# Patient Record
Sex: Female | Born: 1980 | Hispanic: Yes | Marital: Married | Smoking: Never smoker
Health system: Southern US, Community
[De-identification: ages and names within clinical notes are randomized; demographics above are authoritative.]

## PROBLEM LIST (undated history)

## (undated) DIAGNOSIS — K829 Disease of gallbladder, unspecified: Secondary | ICD-10-CM

## (undated) DIAGNOSIS — I10 Essential (primary) hypertension: Secondary | ICD-10-CM

## (undated) DIAGNOSIS — K219 Gastro-esophageal reflux disease without esophagitis: Secondary | ICD-10-CM

## (undated) DIAGNOSIS — D649 Anemia, unspecified: Secondary | ICD-10-CM

## (undated) DIAGNOSIS — Z803 Family history of malignant neoplasm of breast: Secondary | ICD-10-CM

## (undated) DIAGNOSIS — Z1371 Encounter for nonprocreative screening for genetic disease carrier status: Secondary | ICD-10-CM

## (undated) HISTORY — DX: Disease of gallbladder, unspecified: K82.9

## (undated) HISTORY — DX: Anemia, unspecified: D64.9

## (undated) HISTORY — DX: Essential (primary) hypertension: I10

## (undated) HISTORY — PX: CHOLECYSTECTOMY: SHX55

## (undated) HISTORY — DX: Encounter for nonprocreative screening for genetic disease carrier status: Z13.71

## (undated) HISTORY — PX: BUNIONECTOMY: SHX129

## (undated) HISTORY — DX: Family history of malignant neoplasm of breast: Z80.3

## (undated) HISTORY — DX: Gastro-esophageal reflux disease without esophagitis: K21.9

---

## 2006-08-09 ENCOUNTER — Other Ambulatory Visit: Admission: RE | Admit: 2006-08-09 | Discharge: 2006-08-09 | Payer: Self-pay | Admitting: Gynecology

## 2007-05-06 ENCOUNTER — Emergency Department: Payer: Self-pay | Admitting: Emergency Medicine

## 2009-04-06 ENCOUNTER — Encounter: Admission: RE | Admit: 2009-04-06 | Discharge: 2009-04-06 | Payer: Self-pay | Admitting: Otolaryngology

## 2011-09-30 ENCOUNTER — Emergency Department: Payer: Self-pay | Admitting: Emergency Medicine

## 2011-09-30 LAB — URINALYSIS, COMPLETE
Bilirubin,UR: NEGATIVE
Ketone: NEGATIVE
Specific Gravity: 1.009 (ref 1.003–1.030)
Squamous Epithelial: 35

## 2012-02-14 ENCOUNTER — Observation Stay: Payer: Self-pay

## 2012-02-14 LAB — PIH PROFILE
Anion Gap: 5 — ABNORMAL LOW (ref 7–16)
BUN: 5 mg/dL — ABNORMAL LOW (ref 7–18)
Chloride: 108 mmol/L — ABNORMAL HIGH (ref 98–107)
Creatinine: 0.51 mg/dL — ABNORMAL LOW (ref 0.60–1.30)
EGFR (African American): >60
EGFR (Non-African Amer.): >60
Glucose: 75 mg/dL (ref 65–99)
HGB: 13.1 g/dL (ref 12.0–16.0)
MCH: 30.9 pg (ref 26.0–34.0)
RBC: 4.22 10*6/uL (ref 3.80–5.20)
SGOT(AST): 20 U/L (ref 15–37)
Uric Acid: 4.1 mg/dL (ref 2.6–6.0)

## 2012-02-14 LAB — PROTEIN / CREATININE RATIO, URINE
Creatinine, Urine: 51.5 mg/dL (ref 30.0–125.0)
Protein, Random Urine: 14 mg/dL — ABNORMAL HIGH (ref 0–12)

## 2012-02-19 ENCOUNTER — Observation Stay: Payer: Self-pay

## 2012-02-19 LAB — PIH PROFILE
BUN: 4 mg/dL — ABNORMAL LOW (ref 7–18)
Creatinine: 0.46 mg/dL — ABNORMAL LOW (ref 0.60–1.30)
EGFR (Non-African Amer.): >60
Glucose: 61 mg/dL — ABNORMAL LOW (ref 65–99)
HCT: 35 % (ref 35.0–47.0)
Osmolality: 274 (ref 275–301)
Platelet: 191 10*3/uL (ref 150–440)
Potassium: 3.8 mmol/L (ref 3.5–5.1)
RBC: 4.02 10*6/uL (ref 3.80–5.20)
Sodium: 140 mmol/L (ref 136–145)
WBC: 7.5 10*3/uL (ref 3.6–11.0)

## 2012-02-19 LAB — PROTEIN / CREATININE RATIO, URINE: Protein, Random Urine: 20 mg/dL — ABNORMAL HIGH (ref 0–12)

## 2012-02-23 ENCOUNTER — Observation Stay: Payer: Self-pay | Admitting: Obstetrics and Gynecology

## 2012-02-28 ENCOUNTER — Inpatient Hospital Stay: Payer: Self-pay

## 2012-02-28 ENCOUNTER — Ambulatory Visit: Payer: Self-pay | Admitting: Obstetrics and Gynecology

## 2012-02-28 LAB — CBC WITH DIFFERENTIAL/PLATELET
Basophil %: 0.6 %
Eosinophil %: 0.5 %
HGB: 13.4 g/dL (ref 12.0–16.0)
Lymphocyte %: 21.9 %
Monocyte %: 5.2 %
Neutrophil %: 71.8 %
Platelet: 182 10*3/uL (ref 150–440)
RBC: 4.47 10*6/uL (ref 3.80–5.20)

## 2012-02-29 HISTORY — PX: DILATION AND CURETTAGE OF UTERUS: SHX78

## 2012-02-29 HISTORY — PX: TUBAL LIGATION: SHX77

## 2012-03-02 LAB — PATHOLOGY REPORT

## 2012-03-31 DIAGNOSIS — Z1371 Encounter for nonprocreative screening for genetic disease carrier status: Secondary | ICD-10-CM

## 2012-03-31 HISTORY — DX: Encounter for nonprocreative screening for genetic disease carrier status: Z13.71

## 2012-04-11 ENCOUNTER — Ambulatory Visit: Payer: Self-pay | Admitting: Obstetrics and Gynecology

## 2012-04-11 LAB — COMPREHENSIVE METABOLIC PANEL
Alkaline Phosphatase: 122 U/L (ref 50–136)
Chloride: 108 mmol/L — ABNORMAL HIGH (ref 98–107)
EGFR (African American): >60
EGFR (Non-African Amer.): >60
Glucose: 89 mg/dL (ref 65–99)
Osmolality: 283 (ref 275–301)
SGOT(AST): 49 U/L — ABNORMAL HIGH (ref 15–37)

## 2012-04-11 LAB — CBC
HCT: 40.5 % (ref 35.0–47.0)
RDW: 13.6 % (ref 11.5–14.5)

## 2012-04-11 LAB — PREGNANCY, URINE: Pregnancy Test, Urine: NEGATIVE m[IU]/mL

## 2012-04-13 ENCOUNTER — Ambulatory Visit: Payer: Self-pay | Admitting: Obstetrics and Gynecology

## 2012-05-14 ENCOUNTER — Encounter: Payer: Self-pay | Admitting: Gastroenterology

## 2012-05-21 ENCOUNTER — Ambulatory Visit: Payer: Self-pay | Admitting: Family Medicine

## 2012-05-29 ENCOUNTER — Ambulatory Visit: Payer: Self-pay | Admitting: Gastroenterology

## 2012-06-08 ENCOUNTER — Ambulatory Visit: Payer: Self-pay | Admitting: Surgery

## 2012-06-09 ENCOUNTER — Emergency Department: Payer: Self-pay | Admitting: Emergency Medicine

## 2012-06-09 LAB — COMPREHENSIVE METABOLIC PANEL
Albumin: 3.5 g/dL (ref 3.4–5.0)
Alkaline Phosphatase: 98 U/L (ref 50–136)
Anion Gap: 5 — ABNORMAL LOW (ref 7–16)
BUN: 3 mg/dL — ABNORMAL LOW (ref 7–18)
Bilirubin,Total: 0.3 mg/dL (ref 0.2–1.0)
Co2: 27 mmol/L (ref 21–32)
Creatinine: 0.84 mg/dL (ref 0.60–1.30)
Glucose: 117 mg/dL — ABNORMAL HIGH (ref 65–99)
Sodium: 138 mmol/L (ref 136–145)
Total Protein: 7 g/dL (ref 6.4–8.2)

## 2012-06-09 LAB — CBC WITH DIFFERENTIAL/PLATELET
Lymphocyte #: 1.8 10*3/uL (ref 1.0–3.6)
Neutrophil #: 8.9 10*3/uL — ABNORMAL HIGH (ref 1.4–6.5)
Neutrophil %: 76.2 %
WBC: 11.7 10*3/uL — ABNORMAL HIGH (ref 3.6–11.0)

## 2012-06-09 LAB — URINALYSIS, COMPLETE
Ketone: NEGATIVE
Nitrite: NEGATIVE
Ph: 6 (ref 4.5–8.0)
Protein: NEGATIVE
Squamous Epithelial: 2

## 2013-04-16 IMAGING — US ABDOMEN ULTRASOUND LIMITED
1 series · 13 of 25 positions shown · non-contrast
Comparison: none

REASON FOR EXAM: stat read   RUQ abd pain  gallbladder
COMMENTS:

[Series 1: abdomen ultrasound limited · 0.31mm/px · 13 of 60 slices shown]
[im 1/60]
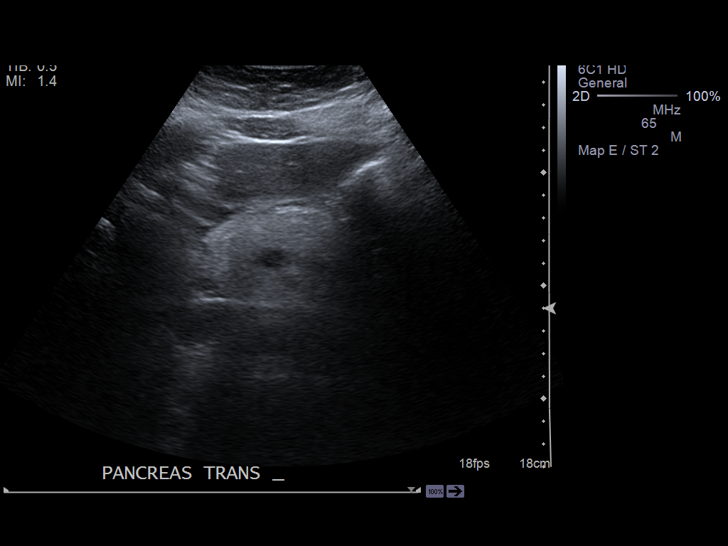
[im 5/60]
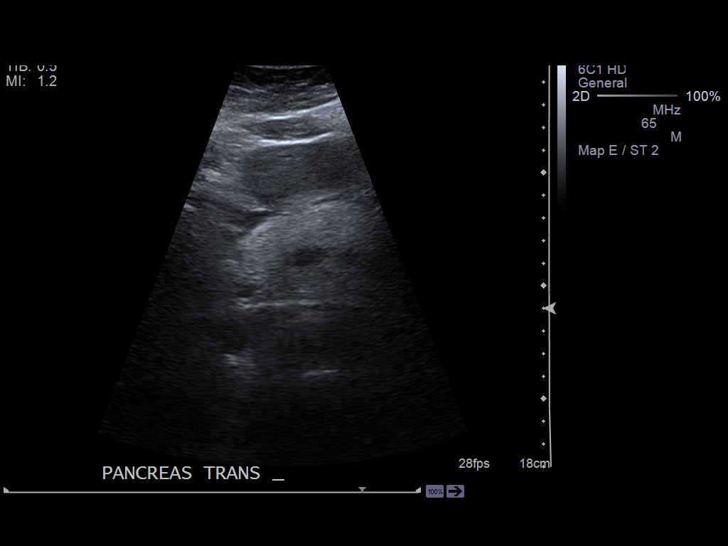
[im 10/60]
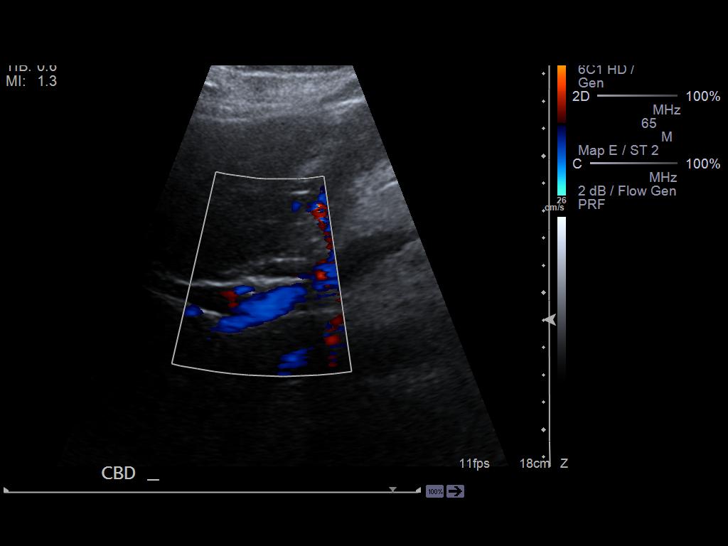
[im 15/60]
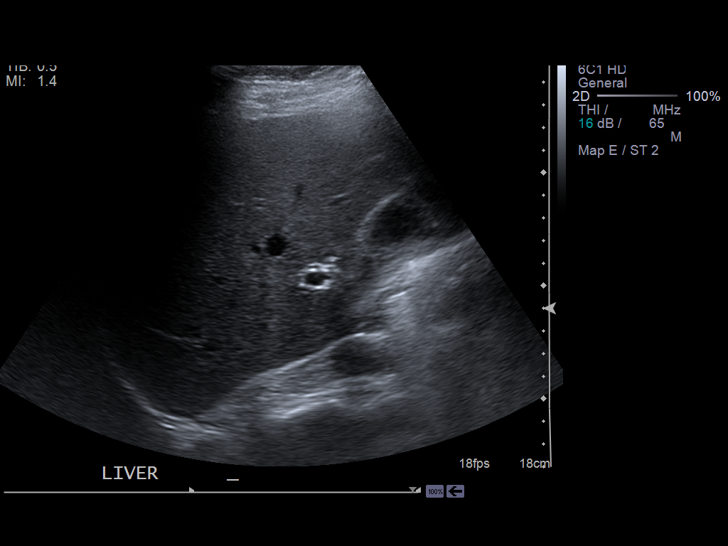
[im 20/60]
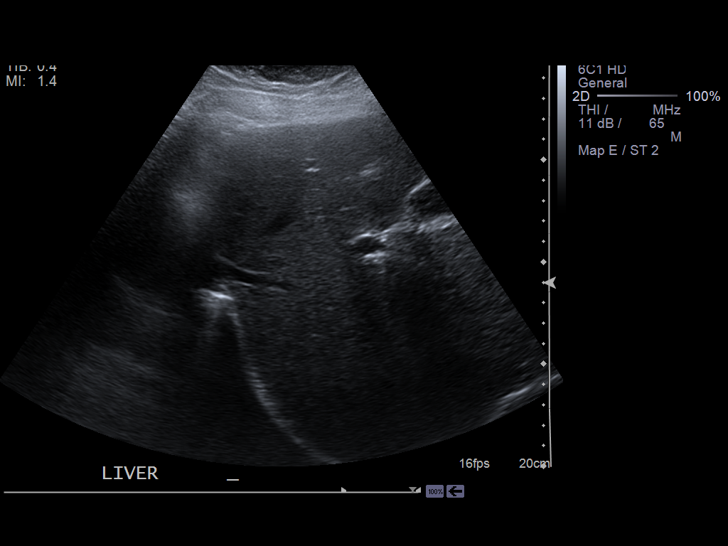
[im 25/60]
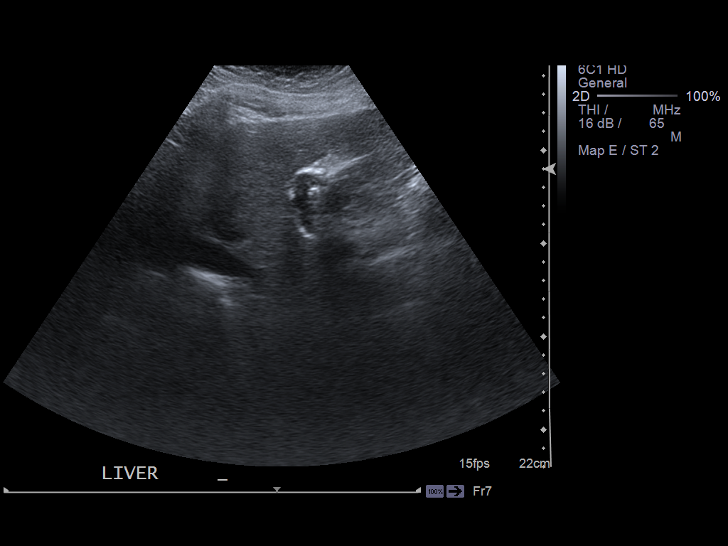
[im 30/60]
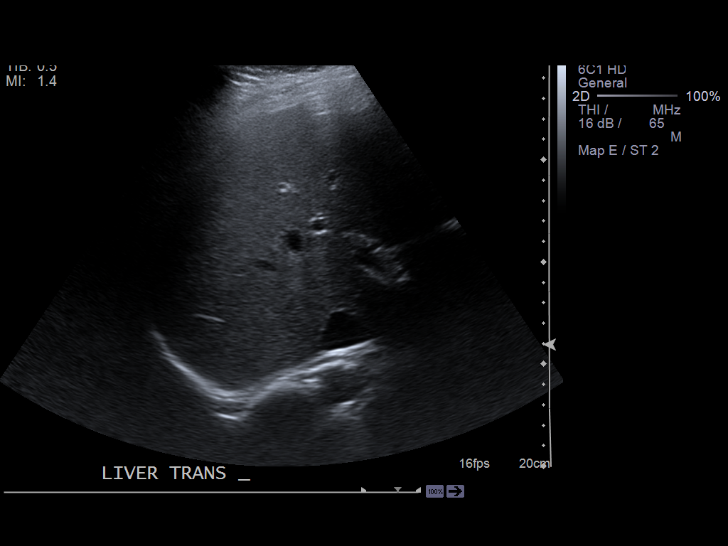
[im 35/60]
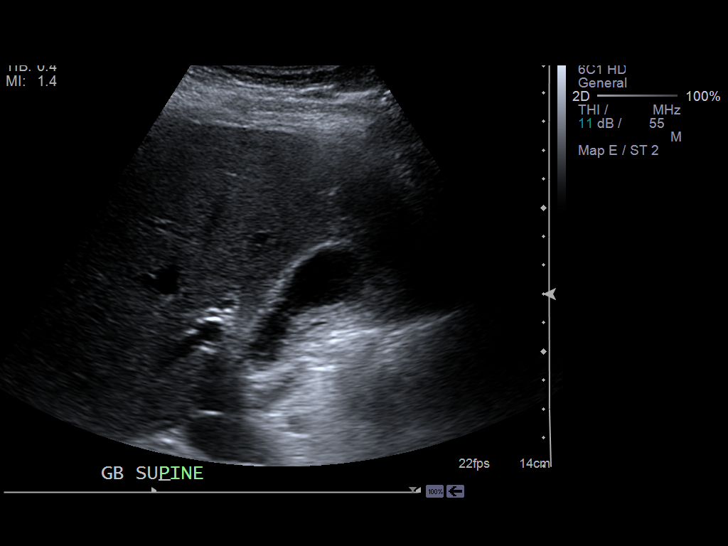
[im 40/60]
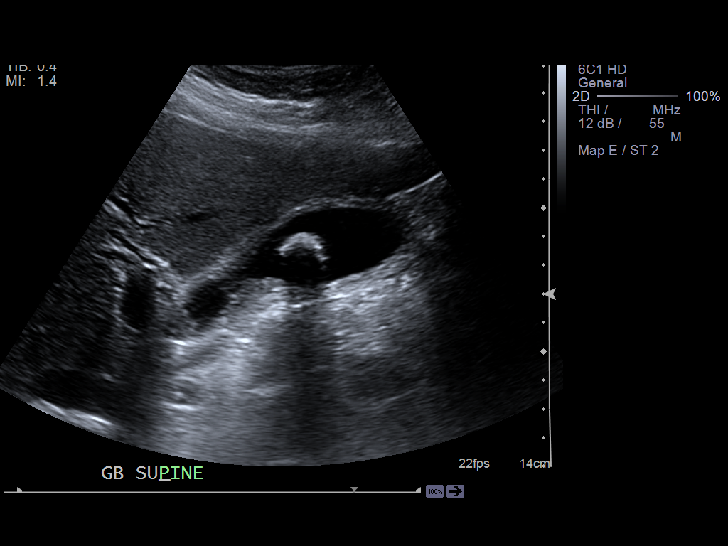
[im 45/60]
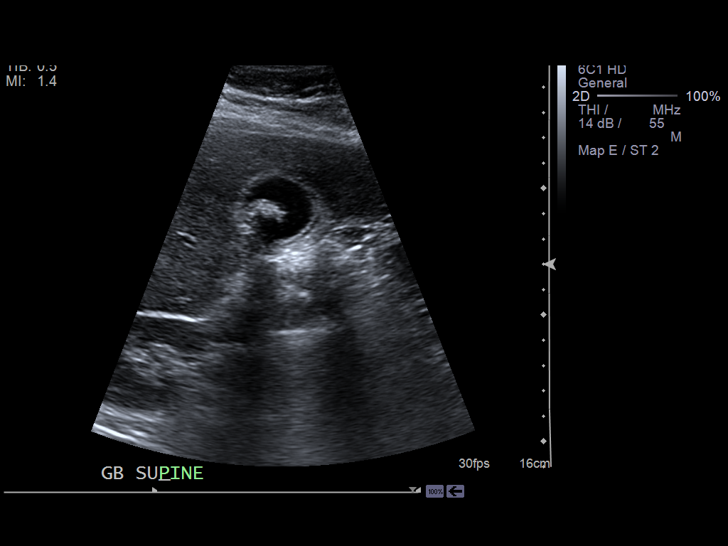
[im 50/60]
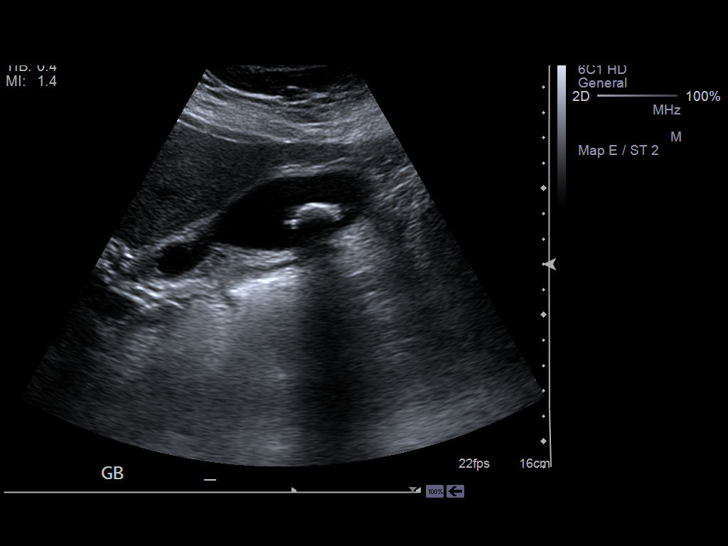
[im 55/60]
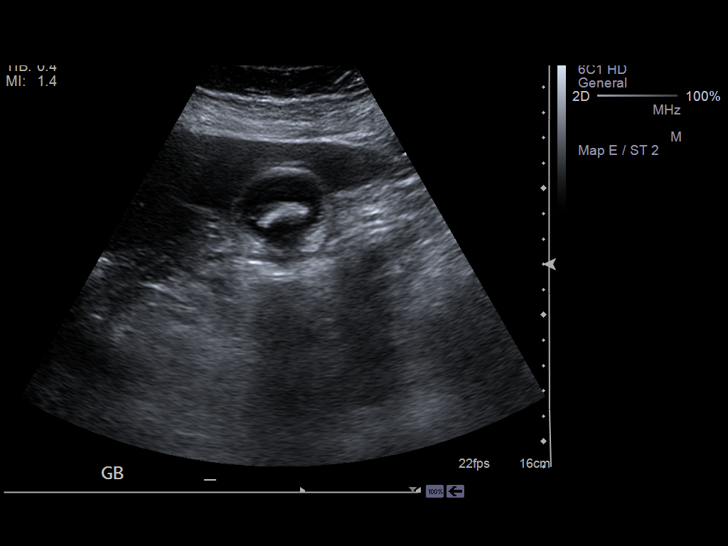
[im 60/60]
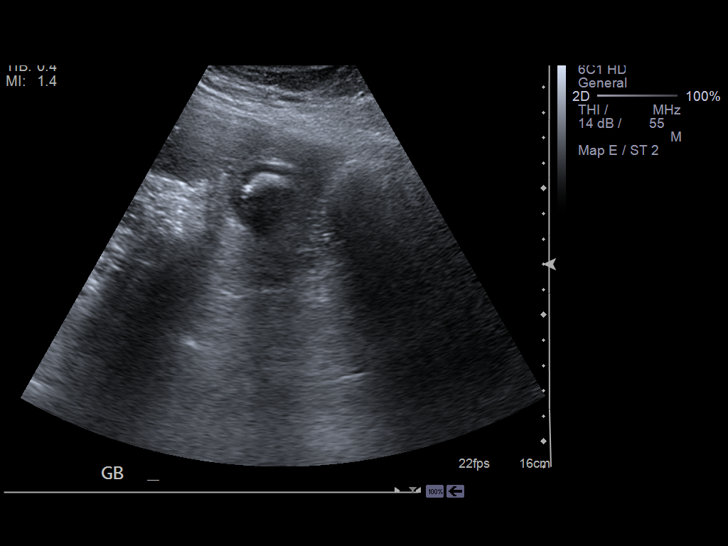

[13 of 25 positions shown; findings below may reference images not displayed]

PROCEDURE:     US  - US ABDOMEN LIMITED SURVEY  - May 21, 2012  [DATE]

RESULT:     Limited right upper quadrant abdominal sonogram is performed.
The patient has no previous exam for comparison.

The visualized pancreatic head, body and tail appear to be normal without
ductal dilation or mass. The common bile duct measures up to 7.0 mm. This is
markedly dilated a 31-year-old patient. Cholelithiasis is demonstrated with
at least a single large stone evident in the gallbladder. This is mobile
with changes in patient position. There is a positive sonographic Murphy's
sign. Gallbladder wall is thickened to 4.8 mm. The liver echotexture is
normal. The liver length is 18.41 cm. There is no intrahepatic biliary
ductal dilation demonstrated. There is no evidence of ascites. The portal
venous flow is normal.
IMPRESSION: 1. Findings concerning for cholelithiasis with acute cholecystitis and
possible common bile duct obstruction given the dilation to 7.0 mm. There is
a positive sonographic Murphy's sign. Gallbladder wall thickening is
demonstrated. Surgical consultation is recommended. No definite intrahepatic
biliary ductal dilation evident.

[REDACTED]

## 2014-06-20 NOTE — Op Note (Signed)
PATIENT NAME:  Donna Peters, Donna Peters MR#:  [id number] DATE OF BIRTH:  03-14-1980  DATE OF PROCEDURE:  06/08/2012  ATTENDING SURGEON:  Cristal Deerhristopher A. Lynniah Janoski, MD.  PREOPERATIVE DIAGNOSIS:  Symptomatic cholelithiasis.   POSTOPERATIVE DIAGNOSIS:  Symptomatic cholelithiasis.   PROCEDURE PERFORMED:  Laparoscopic cholecystectomy with cholangiogram.   ESTIMATED BLOOD LOSS:  75 mL.   COMPLICATIONS:  None.   SPECIMENS:  Gallbladder.   INTRAOPERATIVE FINDINGS:  Normal cholangiogram with easy contrast into the duodenum.   INDICATION FOR SURGERY:  The patient is a pleasant 34 year old female with a history of right upper quadrant pain and gallstones who I had recently seen in the office. I thought that she would benefit from laparoscopic cholecystectomy.   DETAILS OF PROCEDURE:  The informed consent was obtained. The patient was brought to the Operating Room suite. She was laid supine on the Operating Room table. She was induced. Endotracheal tube was placed and general anesthesia was administered. Her abdomen was prepped and draped in standard surgical fashion. A time-out was then performed correctly identifying the patient name, operative site and procedure to be performed. A supraumbilical incision was made. It was deepened down to the fascia. The fascia was incised. The peritoneum was entered. Two stay sutures were placed through the fascia with 0 Vicryl. The Hasson trocar was placed through the fasciotomy. The abdomen was insufflated. The gallbladder was visualized. It appeared to be relatively inflamed. I then placed an 11-mm subxiphoid trocar and two 5-mm midclavicular and anterior axillary trocars. The gallbladder was then retracted over the dome of the liver. The cystic duct and cystic artery were dissected out. The critical view was obtained. A ductotomy was made. A cholangiogram catheter was placed through that. A cholangiogram was performed which showed normal filling of the common bile duct and  filling of the right and left hepatic ducts and easy contrast flow into the duodenum. The cystic duct and cystic artery were then ligated with clips. As I began to take the gallbladder off the gallbladder fossa, I had realized that what I thought was a cystic artery was small and actually I experienced a normal cystic artery and there was a small amount of bleeding but I was able to dissect this out and clipped it 3 times and ligated it. The gallbladder was then taken off the gallbladder fossa. Again there was not a great plane for which I on numerous occasions bovied into liver parenchyma and there was a small amount of bleeding. The gallbladder was then taken off the gallbladder fossa and taken out of the abdomen through an EndoCatch bag. The gallbladder fossa was then evaluated and noted to be diffusely bleeding. I attempted to use electrocautery and there was still diffuse bleeding and therefore I used Surgiflo for hemostasis. After the gallbladder was hemostatic, I irrigated the abdomen and removed all trocars under direct visualization. The supraumbilical incision was closed with a figure-of-eight. The skin was then closed using interrupted 4-0 Monocryl. Steri-Strips, Telfa gauze and Tegaderm were used to complete the dressing. The patient was awoken, extubated and brought to the Postanesthesia Care Unit. There were no immediate complications. Needle, sponge, and instrument count was correct at the end of the procedure.   ____________________________ Si Raiderhristopher A. Yidel Teuscher, MD cal:jm D: 06/08/2012 13:27:56 ET T: 06/08/2012 13:49:20 ET JOB#: [id number]  cc: Cristal Deerhristopher A. Ginnie Marich, MD, <Dictator> Jarvis NewcomerHRISTOPHER A Daisy Mcneel MD ELECTRONICALLY SIGNED 06/09/2012 17:13

## 2014-06-20 NOTE — Op Note (Signed)
PATIENT NAME:  Donna RegisterRIVERA, [name] MR#:  [id number] DATE OF BIRTH:  05-22-1980  DATE OF PROCEDURE:  02/29/2012  PREOPERATIVE DIAGNOSIS: Retained placenta.   POSTOPERATIVE DIAGNOSIS: Retained placenta.   PROCEDURE PERFORMED:  Dilatation and curettage.   SURGEON: Freddi Forster A. Patton SallesWeaver-Lee, M.D.   ANESTHESIA:   Epidural.  ESTIMATED BLOOD LOSS: 500 mL   OPERATIVE FLUIDS: 900 mL.   COMPLICATIONS: None.   FINDINGS: As above.   SPECIMEN: Products of conception.   INDICATIONS FOR SURGERY:  The patient is a 34 year old G2, now P2, who is status post vaginal delivery complicated by shoulder dystocia and cord avulsion. Her placenta was removed manually, however, not completely intact. Therefore, the decision was made to proceed towards dilatation and curettage to remove the remainder of the placental tissue. Risks, benefits, indications, and alternatives of the procedure were explained and informed consent was obtained.   PROCEDURE IN DETAIL: The patient was taken to the Operating Room with IV fluids running. She was prepped and draped in the usual sterile fashion in candy cane stirrups. A speculum was placed inside of the vagina. The anterior lip of the cervix was grasped with a single-tooth tenaculum and the uterus was curetted using a straight-edged banjo curettes. The posterior lip of the cervix was also grasped with a single-tooth tenaculum. However, a portion of the outer edge of the cervix tore inadvertently at that time. This was repaired using #0 Vicryl in a running locked fashion. The uterus was then continued to be curetted using a large banjo curette. Placental tissue was removed, as well as copious clots.  Pitocin was started intraoperatively to help the uterus clamp down.  After it was seen that no more placental tissue was removed, the procedure was ended at that point. All instrumentation was removed from the patient's vagina.   ____________________________ Sonda PrimesLashawn A. Patton SallesWeaver-Lee,  MD law:eg D: 02/29/2012 16:14:21 ET T: 02/29/2012 18:20:18 ET JOB#: [id number]  cc: Flint MelterLashawn A. Patton SallesWeaver-Lee, MD, <Dictator> Janyth ContesLASHAWN A WEAVER LEE MD ELECTRONICALLY SIGNED 03/10/2012 4:36

## 2014-06-20 NOTE — Op Note (Signed)
PATIENT NAME:  Donna Peters, [name] MR#:  [id number] DATE OF BIRTH:  [birth year]  DATE OF PROCEDURE:  04/13/2012  PREOPERATIVE DIAGNOSIS: Desires sterilization.   POSTOPERATIVE DIAGNOSIS: Desires sterilization.   OPERATION PERFORMED: Laparoscopic tubal with bilateral cautery.   SURGEON: Deloris Pinghilip J. Bay Wayson, MD   OPERATIVE FINDINGS: Many pelvic adhesions, especially on the left side. Through much manipulation, both tubes were able to be identified and cauterized, but therefore not divided.   OPERATION: After adequate general anesthesia, the patient was prepped and draped in routine fashion. Approximately 3 liters of carbon dioxide was insufflated without incident. During insufflation, the uterine manipulator was placed, and the bladder was drained. The laparoscope was inserted, and the above findings noted. After careful manipulation, both tubes were identified and followed to the fimbria. Both tubes were cauterized with the cautery. Several additional areas of cautery were performed adjacent to the original cautery. No attempt made at division. CO2 was allowed to escape. The skin was closed in routine fashion. The patient tolerated the procedure well and left the operating room in good condition. Sponge and needle counts were said to be correct at the end of the procedure.    ____________________________ Deloris PingPhilip J. Luella Cookosenow, MD pjr:OSi D: 04/13/2012 13:05:48 ET T: 04/13/2012 13:33:10 ET JOB#: [id number]  cc: Deloris PingPhilip J. Luella Cookosenow, MD, <Dictator> Towana BadgerPHILIP J Japleen Tornow MD ELECTRONICALLY SIGNED 04/17/2012 13:44

## 2014-07-08 NOTE — H&P (Signed)
L&D Evaluation:  History:   HPI 34 yo G2P1001 [facility]d [facility] GHTN presenting for NST and repeat PIH labs.  Patient has a history of primary low transverse c-section for failed IOL (APA) and desires TOLAC.  Denies headache, vision changes, RUQ or epigastric pain, increasing edema, LOF, VB.  Has had minimal intermitten ctx.  +FM  O negative s/p rhogam 10/07, ABSC neg , RI, VZ equivocal, HIV neg, HBsAg neg, RPR NR, pap negative, GC/CT neg & neg, GBS neg, TDAP 11/04.  Abnl 1-hr normal 3-hr  EFW at 3157w0d 6lbs 1oz 42.4%ile.    Presents with PIH panel/NST    Patient's Medical History No Chronic Illness     Patient's Surgical History Previous C-Section     Medications Pre Natal Vitamins     Allergies percocet    Social History none     Family History Non-Contributory    ROS:   ROS All systems were reviewed.  HEENT, CNS, GI, GU, Respiratory, CV, Renal and Musculoskeletal systems were found to be normal.   Exam:   Vital Signs stable     Urine Protein trace    General no apparent distress    Mental Status clear     Abdomen gravid, non-tender    Estimated Fetal Weight Average for gestational age    Fetal Position vtx    Edema no edema     Reflexes 1+     Clonus negative    Pelvic 1/30/-3    Mebranes Intact    FHT normal rate with no decels, reactive NST    Ucx absent   Impression:   Impression evaluation for PIH   Plan:   Plan EFM/NST, monitor BP, PIH panel    Comments 1)  GHTN - continue out of work and modified bedrest.  Normotensive over 1-hr of monitoring, PIH panel stable slight increase in uric acid  2) Fetus - category 1 tracing, AFI scheduled for tomorrow  3) TOLAC - patient desires TOLAC however given concern of rising BP's discussed repeat C-section on 03/01/2012 if not in labor on her own.  I discussed that I would not recommend IOL given she has an unfavorable cervix, untested pelvis, this baby already measures somewhat latger  than her previous baby based on US, and that an induction would take several days and I would need my partners to be comfortable with that plan.    Follow Up Appointment already scheduled   Electronic Signatures: Lorrene ReidStaebler, Shakeyla Giebler M (MD)  (Signed 22-Dec-13 14:55)  Authored: L&D Evaluation   Last Updated: 22-Dec-13 14:55 by Lorrene ReidStaebler, Eydan Chianese M (MD)

## 2014-07-08 NOTE — H&P (Signed)
L&D Evaluation:  History Expanded:   HPI 34 yo G2P1001 whose EDC = 02/28/12.  Pt's first pregnancy terminated via LTCS for FTP.  Pt presents with SROM 2 hours ago.    Blood Type (Maternal) O negative    Group B Strep Results Maternal (Result >5wks must be treated as unknown) negative    Maternal HIV Negative    Maternal Syphilis Ab Nonreactive    Maternal Varicella Equivocal    Rubella Results (Maternal) immune    [facility]    Patient's Surgical History Previous C-Section  foot    Medications Pre Natal Vitamins    Allergies Percocet - rash, hivew    Family History Non-Contributory   ROS:   ROS All systems were reviewed.  HEENT, CNS, GI, GU, Respiratory, CV, Renal and Musculoskeletal systems were found to be normal.   Exam:   Vital Signs stable    General no apparent distress    Mental Status clear    Chest clear    Heart normal sinus rhythm    Abdomen gravid, non-tender    Estimated Fetal Weight Average for gestational age    Pelvic 3 cm    Mebranes Ruptured    Description clear    FHT normal rate with no decels   Impression:   Impression IUP, 41 weeks, SROM, Prior CS   Plan:   Comments I have had an exceedingly lingand careful discussion with this patient about the risk and benefit of both Repeat Cesarean and attempting vaginal birth after cesarean section.  I have detailed in graphic detail all the potential outcomes if there is a uterine rupture, including the possibility of having a brain damaged infant.  Pt desires vaginal birth after cesarean section and I certainly will attmept to honor her wishes.   Electronic Signatures: Towana Badgerosenow, Philip J (MD)  (Signed 31-Dec-13 15:28)  Authored: L&D Evaluation   Last Updated: 31-Dec-13 15:28 by Towana Badgerosenow, Philip J (MD)

## 2016-07-13 DIAGNOSIS — I8390 Asymptomatic varicose veins of unspecified lower extremity: Secondary | ICD-10-CM | POA: Diagnosis not present

## 2016-07-13 DIAGNOSIS — L811 Chloasma: Secondary | ICD-10-CM | POA: Diagnosis not present

## 2017-02-15 ENCOUNTER — Ambulatory Visit (INDEPENDENT_AMBULATORY_CARE_PROVIDER_SITE_OTHER): Payer: BLUE CROSS/BLUE SHIELD | Admitting: Obstetrics and Gynecology

## 2017-02-15 ENCOUNTER — Encounter: Payer: Self-pay | Admitting: Obstetrics and Gynecology

## 2017-02-15 VITALS — BP 148/98 | HR 93 | Ht 62.0 in | Wt 195.0 lb

## 2017-02-15 DIAGNOSIS — Z1231 Encounter for screening mammogram for malignant neoplasm of breast: Secondary | ICD-10-CM | POA: Diagnosis not present

## 2017-02-15 DIAGNOSIS — R87610 Atypical squamous cells of undetermined significance on cytologic smear of cervix (ASC-US): Secondary | ICD-10-CM | POA: Diagnosis not present

## 2017-02-15 DIAGNOSIS — Z124 Encounter for screening for malignant neoplasm of cervix: Secondary | ICD-10-CM | POA: Diagnosis not present

## 2017-02-15 DIAGNOSIS — R030 Elevated blood-pressure reading, without diagnosis of hypertension: Secondary | ICD-10-CM | POA: Diagnosis not present

## 2017-02-15 DIAGNOSIS — Z01419 Encounter for gynecological examination (general) (routine) without abnormal findings: Secondary | ICD-10-CM

## 2017-02-15 DIAGNOSIS — Z1239 Encounter for other screening for malignant neoplasm of breast: Secondary | ICD-10-CM

## 2017-02-15 MED ORDER — PHENTERMINE HCL 37.5 MG PO TABS: 37.5000 mg | ORAL_TABLET | Freq: Every day | ORAL | 0 refills | Status: DC

## 2017-02-15 NOTE — Progress Notes (Signed)
Patient ID: Donna Peters, female   DOB: 15-Mar-1980, 36 y.o.   MRN: [id number]     Gynecology Annual Exam  PCP: Patient, No Pcp Per  Chief Complaint:  Chief Complaint  Patient presents with  . Gynecologic Exam    head stopped up, ear pain  . Weight Loss    Discuss weight loss    History of Present Illness: Patient is a 36 y.o. G2P2 presents for annual exam. The patient has no complaints today.   LMP: Patient's last menstrual period was 02/01/2017. Average Interval: regular, 28 days Duration of flow: 4 days Heavy Menses: no Clots: no Intermenstrual Bleeding: no Postcoital Bleeding: no Dysmenorrhea: no  The patient is sexually active. She currently uses tubal ligation for contraception. She denies dyspareunia.  The patient does perform self breast exams.  There is no notable family history of breast or ovarian cancer in her family.  The patient wears seatbelts: yes.   The patient has regular exercise: not asked.    The patient denies current symptoms of depression.     Patientis a 36 y.o. G2P2 female, who presents for the evaluation of weight gain. She reports inability to loose weight over the past years.. The patient states the following issues have contributed to her weight problem: increased stress, 14 year old son recently diagnosed with autism. The patient has no additional symptoms. The patient specifically denies memory loss, muscle weakness, excessive thirst, and polyuria. Weight related co-morbidities include none. She has tried phentermine interventions in the past with moderate success.   Review of Systems: Review of Systems  Constitutional: Negative for chills and fever.  HENT: Negative for congestion.   Respiratory: Negative for cough and shortness of breath.   Cardiovascular: Negative for chest pain and palpitations.  Gastrointestinal: Negative for abdominal pain, constipation, diarrhea, heartburn, nausea and vomiting.  Genitourinary: Negative for dysuria,  frequency and urgency.  Skin: Negative for itching and rash.  Neurological: Negative for dizziness and headaches.  Endo/Heme/Allergies: Negative for polydipsia.  Psychiatric/Behavioral: Negative for depression.    Past Medical History:  History reviewed. No pertinent past medical history.  Past Surgical History:  Past Surgical History:  Procedure Laterality Date  . BUNIONECTOMY    . CESAREAN SECTION    . DILATION AND CURETTAGE OF UTERUS  2014  . TUBAL LIGATION  2014    Gynecologic History:  Patient's last menstrual period was 02/01/2017. Contraception: tubal ligation Last Pap: Results were:08/19/2015 NIL and HR HPV negative   Obstetric History: G2P2  Family History:  History reviewed. No pertinent family history.  Social History:  Social History   Socioeconomic History  . Marital status: Married    Spouse name: Not on file  . Number of children: Not on file  . Years of education: Not on file  . Highest education level: Not on file  Social Needs  . Financial resource strain: Not on file  . Food insecurity - worry: Not on file  . Food insecurity - inability: Not on file  . Transportation needs - medical: Not on file  . Transportation needs - non-medical: Not on file  Occupational History  . Not on file  Tobacco Use  . Smoking status: Never Smoker  . Smokeless tobacco: Never Used  Substance and Sexual Activity  . Alcohol use: Yes  . Drug use: No  . Sexual activity: Yes    Partners: Male    Birth control/protection: Patch, Surgical    Comment: Tubal ligation  Other Topics Concern  .  Not on file  Social History Narrative  . Not on file    Allergies:  No Known Allergies  Medications: Prior to Admission medications   Not on File    Physical Exam Vitals: Blood pressure (!) 148/98, pulse 93, height 5' 2"  (1.575 m), weight 195 lb (88.5 kg), last menstrual period 02/01/2017.  General: NAD HEENT: normocephalic, anicteric Thyroid: no enlargement, no  palpable nodules Pulmonary: No increased work of breathing, CTAB Cardiovascular: RRR, distal pulses 2+ Breast: Breast symmetrical, no tenderness, no palpable nodules or masses, no skin or nipple retraction present, no nipple discharge.  No axillary or supraclavicular lymphadenopathy. Abdomen: NABS, soft, non-tender, non-distended.  Umbilicus without lesions.  No hepatomegaly, splenomegaly or masses palpable. No evidence of hernia  Genitourinary:  External: Normal external female genitalia.  Normal urethral meatus, normal  Bartholin's and Skene's glands.    Vagina: Normal vaginal mucosa, no evidence of prolapse.    Cervix: Grossly normal in appearance, no bleeding  Uterus: Non-enlarged, mobile, normal contour.  No CMT  Adnexa: ovaries non-enlarged, no adnexal masses  Rectal: deferred  Lymphatic: no evidence of inguinal lymphadenopathy Extremities: no edema, erythema, or tenderness Neurologic: Grossly intact Psychiatric: mood appropriate, affect full  Female chaperone present for pelvic and breast  portions of the physical exam    Assessment: 36 y.o. G2P2 routine annual exam  Plan: Problem List Items Addressed This Visit    None    Visit Diagnoses    Elevated blood pressure reading    -  Primary   Screening for malignant neoplasm of cervix       Relevant Orders   PapIG, HPV, rfx 16/18   Breast screening       Encounter for gynecological examination without abnormal finding         Annual Exam  1) STI screening was not offered  2) ASCCP guidelines and rational discussed.  Patient opts for yearly screening interval  3) Contraception - status post prior BTL, no menstrual complaints  4) Routine healthcare maintenance including cholesterol, diabetes screening discussed Declines  5) BRCA testing 2014 negative. TC lifetime risk 25.5%. 10 year risk 1.5%.  6) Follow up 1 year for routine annual   Medical Weight Loss   1) 1500 Calorie ADA Diet  2) Patient education given  regarding appropriate lifestyle changes for weight loss including: regular physical activity, healthy coping strategies, caloric restriction and healthy eating patterns.  3) Patient will be started on weight loss medication. The risks and benefits and side effects of medication, such as Adipex (Phenteramine) ,  Tenuate (Diethylproprion), Belviq (lorcarsin), Contrave (buproprion/naltrexone), Qsymia (phentermine/topiramate), and Saxenda (liraglutide) is discussed. The pros and cons of suppressing appetite and boosting metabolism is discussed. Risks of tolerence and addiction is discussed for selected agents discussed. Use of medicine will ne short term, such as 3-4 months at a time followed by a period of time off of the medicine to avoid these risks and side effects for Adipex, Qsymia, and Tenuate discussed. Pt to call with any negative side effects and agrees to keep follow up appts.  4) Comorbidity Screening - hypothyroidism screening, diabetes, and hyperlipidemia screening offered  5) Encouraged weekly weight monitorig to track progress and sample 1 week food diary  6) Contraception - discussed that all weight loss drugs fall in to pregnancy category X, patient currently has reliable contraception in the form of  Prior tubal ligation  7) 15 minutes face-to-face; counseling/coordination of care > 50 percent of visit  8) Follow up in  4 weeks to assess response, 1 week BP check.  Hypertensive at today's visit, has perviously tolerated phentermine well from BP standpoint.  Consider belviq if evidence of elevated BP on phentermine.

## 2017-02-15 NOTE — Patient Instructions (Signed)
Preventive Care 18-39 Years, Female Preventive care refers to lifestyle choices and visits with your health care provider that can promote health and wellness. What does preventive care include?  A yearly physical exam. This is also called an annual well check.  Dental exams once or twice a year.  Routine eye exams. Ask your health care provider how often you should have your eyes checked.  Personal lifestyle choices, including: ? Daily care of your teeth and gums. ? Regular physical activity. ? Eating a healthy diet. ? Avoiding tobacco and drug use. ? Limiting alcohol use. ? Practicing safe sex. ? Taking vitamin and mineral supplements as recommended by your health care provider. What happens during an annual well check? The services and screenings done by your health care provider during your annual well check will depend on your age, overall health, lifestyle risk factors, and family history of disease. Counseling Your health care provider may ask you questions about your:  Alcohol use.  Tobacco use.  Drug use.  Emotional well-being.  Home and relationship well-being.  Sexual activity.  Eating habits.  Work and work Statistician.  Method of birth control.  Menstrual cycle.  Pregnancy history.  Screening You may have the following tests or measurements:  Height, weight, and BMI.  Diabetes screening. This is done by checking your blood sugar (glucose) after you have not eaten for a while (fasting).  Blood pressure.  Lipid and cholesterol levels. These may be checked every 5 years starting at age 38.  Skin check.  Hepatitis C blood test.  Hepatitis B blood test.  Sexually transmitted disease (STD) testing.  BRCA-related cancer screening. This may be done if you have a family history of breast, ovarian, tubal, or peritoneal cancers.  Pelvic exam and Pap test. This may be done every 3 years starting at age 38. Starting at age 30, this may be done  every 5 years if you have a Pap test in combination with an HPV test.  Discuss your test results, treatment options, and if necessary, the need for more tests with your health care provider. Vaccines Your health care provider may recommend certain vaccines, such as:  Influenza vaccine. This is recommended every year.  Tetanus, diphtheria, and acellular pertussis (Tdap, Td) vaccine. You may need a Td booster every 10 years.  Varicella vaccine. You may need this if you have not been vaccinated.  HPV vaccine. If you are 39 or younger, you may need three doses over 6 months.  Measles, mumps, and rubella (MMR) vaccine. You may need at least one dose of MMR. You may also need a second dose.  Pneumococcal 13-valent conjugate (PCV13) vaccine. You may need this if you have certain conditions and were not previously vaccinated.  Pneumococcal polysaccharide (PPSV23) vaccine. You may need one or two doses if you smoke cigarettes or if you have certain conditions.  Meningococcal vaccine. One dose is recommended if you are age 68-21 years and a first-year college student living in a residence hall, or if you have one of several medical conditions. You may also need additional booster doses.  Hepatitis A vaccine. You may need this if you have certain conditions or if you travel or work in places where you may be exposed to hepatitis A.  Hepatitis B vaccine. You may need this if you have certain conditions or if you travel or work in places where you may be exposed to hepatitis B.  Haemophilus influenzae type b (Hib) vaccine. You may need this  if you have certain risk factors.  Talk to your health care provider about which screenings and vaccines you need and how often you need them. This information is not intended to replace advice given to you by your health care provider. Make sure you discuss any questions you have with your health care provider. Document Released: 04/12/2001 Document Revised:  11/04/2015 Document Reviewed: 12/16/2014 Elsevier Interactive Patient Education  2018 Elsevier Inc.  

## 2017-02-21 LAB — PAPIG, HPV, RFX 16/18
HPV, high-risk: NEGATIVE
PAP Smear Comment: 0

## 2017-02-23 ENCOUNTER — Encounter: Payer: Self-pay | Admitting: Obstetrics and Gynecology

## 2017-02-23 ENCOUNTER — Ambulatory Visit (INDEPENDENT_AMBULATORY_CARE_PROVIDER_SITE_OTHER): Payer: BLUE CROSS/BLUE SHIELD | Admitting: Obstetrics and Gynecology

## 2017-02-23 VITALS — BP 130/84 | HR 114 | Wt 191.0 lb

## 2017-02-23 DIAGNOSIS — Z0130 Encounter for examination of blood pressure without abnormal findings: Secondary | ICD-10-CM | POA: Diagnosis not present

## 2017-02-23 NOTE — Progress Notes (Signed)
     Obstetrics & Gynecology Office Visit   Chief Complaint:  Chief Complaint  Patient presents with  . Blood Pressure Check    History of Present Illness: 36 year old presenting for 1 week blood pressure check.  No issues since starting phentermine and has lost 4 lbs.  Denies HA,vision changes, palpitations.     Review of Systems: negative unless noted in HPI  Past Medical History:  No past medical history on file.  Past Surgical History:  Past Surgical History:  Procedure Laterality Date  . BUNIONECTOMY    . CESAREAN SECTION    . DILATION AND CURETTAGE OF UTERUS  2014  . TUBAL LIGATION  2014    Gynecologic History: Patient's last menstrual period was 02/01/2017.  Obstetric History: G2P2  Family History:  No family history on file.  Social History:  Social History   Socioeconomic History  . Marital status: Married    Spouse name: Not on file  . Number of children: Not on file  . Years of education: Not on file  . Highest education level: Not on file  Social Needs  . Financial resource strain: Not on file  . Food insecurity - worry: Not on file  . Food insecurity - inability: Not on file  . Transportation needs - medical: Not on file  . Transportation needs - non-medical: Not on file  Occupational History  . Not on file  Tobacco Use  . Smoking status: Never Smoker  . Smokeless tobacco: Never Used  Substance and Sexual Activity  . Alcohol use: Yes  . Drug use: No  . Sexual activity: Yes    Partners: Male    Birth control/protection: Patch, Surgical    Comment: Tubal ligation  Other Topics Concern  . Not on file  Social History Narrative  . Not on file    Allergies:  No Known Allergies  Medications: Prior to Admission medications   Medication Sig Start Date End Date Taking? Authorizing Provider  phentermine (ADIPEX-P) 37.5 MG tablet Take 1 tablet (37.5 mg total) by mouth daily before breakfast. 02/15/17   Vena AustriaStaebler, Sheri Prows, MD    Physical  Exam Blood pressure 130/84, pulse (!) 114, weight 191 lb (86.6 kg), last menstrual period 02/01/2017.  General: NAD HEENT: normocephalic, anicteric Pulmonary: No increased work of breathing Neurologic: Grossly intact Psychiatric: mood appropriate, affect full  Female chaperone present for pelvic and breast  portions of the physical exam  Assessment: 36 y.o. G2P2 BP check  Plan: Problem List Items Addressed This Visit    None    Visit Diagnoses    Blood pressure check    -  Primary     Remains normotensive on phentermine with good weightloss, continue phentermine

## 2017-03-01 DIAGNOSIS — H66001 Acute suppurative otitis media without spontaneous rupture of ear drum, right ear: Secondary | ICD-10-CM | POA: Diagnosis not present

## 2017-03-01 DIAGNOSIS — J019 Acute sinusitis, unspecified: Secondary | ICD-10-CM | POA: Diagnosis not present

## 2017-03-01 DIAGNOSIS — R030 Elevated blood-pressure reading, without diagnosis of hypertension: Secondary | ICD-10-CM | POA: Diagnosis not present

## 2017-03-01 DIAGNOSIS — B9689 Other specified bacterial agents as the cause of diseases classified elsewhere: Secondary | ICD-10-CM | POA: Diagnosis not present

## 2017-03-17 ENCOUNTER — Encounter: Payer: Self-pay | Admitting: Obstetrics and Gynecology

## 2017-03-17 ENCOUNTER — Ambulatory Visit (INDEPENDENT_AMBULATORY_CARE_PROVIDER_SITE_OTHER): Payer: BLUE CROSS/BLUE SHIELD | Admitting: Obstetrics and Gynecology

## 2017-03-17 VITALS — BP 144/98 | HR 101 | Wt 187.0 lb

## 2017-03-17 DIAGNOSIS — Z6834 Body mass index (BMI) 34.0-34.9, adult: Secondary | ICD-10-CM

## 2017-03-17 DIAGNOSIS — E669 Obesity, unspecified: Secondary | ICD-10-CM

## 2017-03-17 MED ORDER — PHENTERMINE HCL 37.5 MG PO TABS: 37.5000 mg | ORAL_TABLET | Freq: Every day | ORAL | 0 refills | Status: DC

## 2017-03-17 NOTE — Progress Notes (Signed)
Gynecology Office Visit  Chief Complaint:  Chief Complaint  Patient presents with  . Weight Check  . medication follow up    B/P check    History of Present Illness: Patientis a 37 y.o. G2P2 female, who presents for the evaluation of the desire to lose weight. She has lost 8 pounds. The patient states the following symptoms since starting her weight loss therapy: appetite suppression, energy, and weight loss.  The patient also reports no other ill effects. The patient specifically denies heart palpitations, anxiety, and insomnia.    Review of Systems: 10 point review of systems negative unless otherwise noted in HPI  Past Medical History:  No past medical history on file.  Past Surgical History:  Past Surgical History:  Procedure Laterality Date  . BUNIONECTOMY    . CESAREAN SECTION    . DILATION AND CURETTAGE OF UTERUS  2014  . TUBAL LIGATION  2014    Gynecologic History: Patient's last menstrual period was 03/01/2017.  Obstetric History: G2P2  Family History:  No family history on file.  Social History:  Social History   Socioeconomic History  . Marital status: Married    Spouse name: Not on file  . Number of children: Not on file  . Years of education: Not on file  . Highest education level: Not on file  Social Needs  . Financial resource strain: Not on file  . Food insecurity - worry: Not on file  . Food insecurity - inability: Not on file  . Transportation needs - medical: Not on file  . Transportation needs - non-medical: Not on file  Occupational History  . Not on file  Tobacco Use  . Smoking status: Never Smoker  . Smokeless tobacco: Never Used  Substance and Sexual Activity  . Alcohol use: Yes  . Drug use: No  . Sexual activity: Yes    Partners: Male    Birth control/protection: Patch, Surgical    Comment: Tubal ligation  Other Topics Concern  . Not on file  Social History Narrative  . Not on file    Allergies:  No Known  Allergies  Medications: Prior to Admission medications   Medication Sig Start Date End Date Taking? Authorizing Provider  phentermine (ADIPEX-P) 37.5 MG tablet Take 1 tablet (37.5 mg total) by mouth daily before breakfast. 02/15/17   Vena Austria, MD    Physical Exam Blood pressure (!) 144/98, pulse (!) 101, weight 187 lb (84.8 kg), last menstrual period 03/01/2017. Body mass index is 34.2 kg/m.  General: NAD HEENT: normocephalic, anicteric Thyroid: no enlargement Pulmonary: no increased work of breathing Neurologic: Grossly intact Psychiatric: mood appropriate, affect full  Assessment: 37 y.o. G2P2 No problem-specific Assessment & Plan notes found for this encounter.   Plan: Problem List Items Addressed This Visit    None    Visit Diagnoses    Class 1 obesity without serious comorbidity with body mass index (BMI) of 34.0 to 34.9 in adult, unspecified obesity type    -  Primary   Relevant Medications   phentermine (ADIPEX-P) 37.5 MG tablet      1) 1500 Calorie ADA Diet  2) Patient education given regarding appropriate lifestyle changes for weight loss including: regular physical activity, healthy coping strategies, caloric restriction and healthy eating patterns.  3) Patient will be started on weight loss medication. The risks and benefits and side effects of medication, such as Adipex (Phenteramine) ,  Tenuate (Diethylproprion), Belviq (lorcarsin), Contrave (buproprion/naltrexone), Qsymia (phentermine/topiramate), and Saxenda (  liraglutide) is discussed. The pros and cons of suppressing appetite and boosting metabolism is discussed. Risks of tolerence and addiction is discussed for selected agents discussed. Use of medicine will ne short term, such as 3-4 months at a time followed by a period of time off of the medicine to avoid these risks and side effects for Adipex, Qsymia, and Tenuate discussed. Pt to call with any negative side effects and agrees to keep follow up  appts.  4) Patient to take medication, with the benefits of appetite suppression and metabolism boost d/w pt, along with the side effects and risk factors of long term use that will be avoided with our use of short bursts of therapy. Rx provided.    5) 15 minutes face-to-face; with counseling/coordination of care > 50 percent of visit related to obesity and ongoing management/treatment   6) Follow up in 4 weeks to assess response

## 2017-04-04 DIAGNOSIS — R030 Elevated blood-pressure reading, without diagnosis of hypertension: Secondary | ICD-10-CM | POA: Diagnosis not present

## 2017-04-04 DIAGNOSIS — R202 Paresthesia of skin: Secondary | ICD-10-CM | POA: Diagnosis not present

## 2017-04-18 ENCOUNTER — Ambulatory Visit (INDEPENDENT_AMBULATORY_CARE_PROVIDER_SITE_OTHER): Payer: BLUE CROSS/BLUE SHIELD | Admitting: Obstetrics and Gynecology

## 2017-04-18 ENCOUNTER — Ambulatory Visit: Payer: BLUE CROSS/BLUE SHIELD | Admitting: Obstetrics and Gynecology

## 2017-04-18 VITALS — BP 170/100 | HR 98 | Ht 62.0 in | Wt 184.0 lb

## 2017-04-18 DIAGNOSIS — E669 Obesity, unspecified: Secondary | ICD-10-CM | POA: Diagnosis not present

## 2017-04-18 DIAGNOSIS — I10 Essential (primary) hypertension: Secondary | ICD-10-CM | POA: Diagnosis not present

## 2017-04-18 DIAGNOSIS — Z6833 Body mass index (BMI) 33.0-33.9, adult: Secondary | ICD-10-CM

## 2017-04-18 MED ORDER — LIRAGLUTIDE -WEIGHT MANAGEMENT 18 MG/3ML ~~LOC~~ SOPN: 3.0000 mg | PEN_INJECTOR | Freq: Every day | SUBCUTANEOUS | 2 refills | Status: DC

## 2017-04-18 MED ORDER — LISINOPRIL 5 MG PO TABS: 5.0000 mg | ORAL_TABLET | Freq: Every day | ORAL | 3 refills | Status: DC

## 2017-04-18 MED ORDER — INSULIN PEN NEEDLE 32G X 6 MM MISC: 1.0000 [IU] | Freq: Every day | 3 refills | Status: DC

## 2017-04-18 NOTE — Progress Notes (Signed)
Gynecology Office Visit  Chief Complaint:  Chief Complaint  Patient presents with  . Weight Check    History of Present Illness: Patientis a 37 y.o. G2P2 female, who presents for the evaluation of the desire to lose weight. She has lost 3 pounds for 2 month total weight loss of 7lbs. The patient states the following symptoms since starting her weight loss therapy: appetite suppression, energy, and weight loss.  The patient also reports no other ill effects. The patient specifically denies heart palpitations, anxiety, and insomnia.  She did have headaches and dizzines was evaluated at PCP and noted to be hypertensive.  She has a history of hypertension was previously on lisinopril 5mg .  She has discontinued the phentermine since 04/04/17 but remains hypertensive at today's visit.     Review of Systems: 10 point review of systems negative unless otherwise noted in HPI  Past Medical History:  No past medical history on file.  Past Surgical History:  Past Surgical History:  Procedure Laterality Date  . BUNIONECTOMY    . CESAREAN SECTION    . DILATION AND CURETTAGE OF UTERUS  2014  . TUBAL LIGATION  2014    Gynecologic History: No LMP recorded.  Obstetric History: G2P2  Family History:  No family history on file.  Social History:  Social History   Socioeconomic History  . Marital status: Married    Spouse name: Not on file  . Number of children: Not on file  . Years of education: Not on file  . Highest education level: Not on file  Social Needs  . Financial resource strain: Not on file  . Food insecurity - worry: Not on file  . Food insecurity - inability: Not on file  . Transportation needs - medical: Not on file  . Transportation needs - non-medical: Not on file  Occupational History  . Not on file  Tobacco Use  . Smoking status: Never Smoker  . Smokeless tobacco: Never Used  Substance and Sexual Activity  . Alcohol use: Yes  . Drug use: No  . Sexual  activity: Yes    Partners: Male    Birth control/protection: Patch, Surgical    Comment: Tubal ligation  Other Topics Concern  . Not on file  Social History Narrative  . Not on file    Allergies:  No Known Allergies  Medications: Prior to Admission medications   Medication Sig Start Date End Date Taking? Authorizing Provider  phentermine (ADIPEX-P) 37.5 MG tablet Take 1 tablet (37.5 mg total) by mouth daily before breakfast. 03/17/17  Yes Vena Austria, MD    Physical Exam Blood pressure (!) 170/100, pulse 98, height 5\' 2"  (1.575 m), weight 184 lb (83.5 kg). Wt Readings from Last 3 Encounters:  04/18/17 184 lb (83.5 kg)  03/17/17 187 lb (84.8 kg)  02/23/17 191 lb (86.6 kg)   Body mass index is 33.65 kg/m.  General: NAD HEENT: normocephalic, anicteric Thyroid: no enlargement Pulmonary: no increased work of breathing Neurologic: Grossly intact Psychiatric: mood appropriate, affect full  Assessment: 37 y.o. G2P2 No problem-specific Assessment & Plan notes found for this encounter.   Plan: Problem List Items Addressed This Visit      Cardiovascular and Mediastinum   Essential hypertension   Relevant Medications   lisinopril (PRINIVIL,ZESTRIL) 5 MG tablet    Other Visit Diagnoses    Class 1 obesity without serious comorbidity with body mass index (BMI) of 33.0 to 33.9 in adult, unspecified obesity type    -  Primary   Relevant Medications   Liraglutide -Weight Management (SAXENDA) 18 MG/3ML SOPN      1) 1500 Calorie ADA Diet  2) Patient education given regarding appropriate lifestyle changes for weight loss including: regular physical activity, healthy coping strategies, caloric restriction and healthy eating patterns.  3) Patient will be started on weight loss medication. The risks and benefits and side effects of medication, such as Adipex (Phenteramine) ,  Tenuate (Diethylproprion), Belviq (lorcarsin), Contrave (buproprion/naltrexone), Qsymia  (phentermine/topiramate), and Saxenda (liraglutide) is discussed. The pros and cons of suppressing appetite and boosting metabolism is discussed. Risks of tolerence and addiction is discussed for selected agents discussed. Use of medicine will ne short term, such as 3-4 months at a time followed by a period of time off of the medicine to avoid these risks and side effects for Adipex, Qsymia, and Tenuate discussed. Pt to call with any negative side effects and agrees to keep follow up appts. - discontinued phentermine secondary to BP elevation, switch to saxenda - BP remains elevated today, restart lisinopril 5mg  has PCP follow up 05/24/17 [facility]  4) Patient to take medication, with the benefits of appetite suppression and metabolism boost d/w pt, along with the side effects and risk factors of long term use that will be avoided with our use of short bursts of therapy.    5) 15 minutes face-to-face; with counseling/coordination of care > 50 percent of visit related to obesity and ongoing management/treatment   6)    Vena AustriaAndreas Alyene Predmore, MD, Merlinda FrederickFACOG [facility], [facility] 04/19/2017, 1:13 PM

## 2017-04-19 ENCOUNTER — Ambulatory Visit: Payer: BLUE CROSS/BLUE SHIELD | Admitting: Obstetrics and Gynecology

## 2017-04-19 DIAGNOSIS — I10 Essential (primary) hypertension: Secondary | ICD-10-CM | POA: Insufficient documentation

## 2017-04-26 ENCOUNTER — Telehealth: Payer: Self-pay | Admitting: Obstetrics and Gynecology

## 2017-04-26 NOTE — Telephone Encounter (Signed)
Pt is calling needs an prior Authorization for her Saxenda. Please advise

## 2017-04-26 NOTE — Telephone Encounter (Signed)
Pt aware PA was resubmitted on 2/26. I will call her with status once we hear something

## 2017-04-28 ENCOUNTER — Encounter: Payer: Self-pay | Admitting: Obstetrics and Gynecology

## 2017-06-12 DIAGNOSIS — J040 Acute laryngitis: Secondary | ICD-10-CM | POA: Diagnosis not present

## 2017-06-23 DIAGNOSIS — Z87891 Personal history of nicotine dependence: Secondary | ICD-10-CM | POA: Diagnosis not present

## 2017-06-23 DIAGNOSIS — R05 Cough: Secondary | ICD-10-CM | POA: Diagnosis not present

## 2017-06-23 DIAGNOSIS — I10 Essential (primary) hypertension: Secondary | ICD-10-CM | POA: Diagnosis not present

## 2017-06-23 DIAGNOSIS — J069 Acute upper respiratory infection, unspecified: Secondary | ICD-10-CM | POA: Diagnosis not present

## 2017-06-23 DIAGNOSIS — B9789 Other viral agents as the cause of diseases classified elsewhere: Secondary | ICD-10-CM | POA: Diagnosis not present

## 2017-07-17 ENCOUNTER — Ambulatory Visit: Payer: BLUE CROSS/BLUE SHIELD | Admitting: Obstetrics and Gynecology

## 2017-07-19 DIAGNOSIS — I10 Essential (primary) hypertension: Secondary | ICD-10-CM | POA: Diagnosis not present

## 2017-07-25 ENCOUNTER — Ambulatory Visit: Payer: BLUE CROSS/BLUE SHIELD | Admitting: Obstetrics and Gynecology

## 2017-07-26 DIAGNOSIS — D649 Anemia, unspecified: Secondary | ICD-10-CM | POA: Diagnosis not present

## 2017-07-26 DIAGNOSIS — I10 Essential (primary) hypertension: Secondary | ICD-10-CM | POA: Diagnosis not present

## 2017-07-26 DIAGNOSIS — Z6834 Body mass index (BMI) 34.0-34.9, adult: Secondary | ICD-10-CM | POA: Diagnosis not present

## 2017-10-09 DIAGNOSIS — L309 Dermatitis, unspecified: Secondary | ICD-10-CM | POA: Diagnosis not present

## 2018-02-05 DIAGNOSIS — J069 Acute upper respiratory infection, unspecified: Secondary | ICD-10-CM | POA: Diagnosis not present

## 2018-02-05 DIAGNOSIS — R05 Cough: Secondary | ICD-10-CM | POA: Diagnosis not present

## 2018-02-23 ENCOUNTER — Ambulatory Visit: Payer: BLUE CROSS/BLUE SHIELD | Admitting: Obstetrics and Gynecology

## 2018-03-16 ENCOUNTER — Encounter: Payer: Self-pay | Admitting: Obstetrics and Gynecology

## 2018-03-16 ENCOUNTER — Ambulatory Visit (INDEPENDENT_AMBULATORY_CARE_PROVIDER_SITE_OTHER): Payer: BLUE CROSS/BLUE SHIELD | Admitting: Obstetrics and Gynecology

## 2018-03-16 ENCOUNTER — Other Ambulatory Visit (HOSPITAL_COMMUNITY)
Admission: RE | Admit: 2018-03-16 | Discharge: 2018-03-16 | Disposition: A | Discharge status: Home / Self Care | Payer: BLUE CROSS/BLUE SHIELD | Source: Ambulatory Visit | Attending: Obstetrics and Gynecology | Admitting: Obstetrics and Gynecology

## 2018-03-16 VITALS — BP 128/82 | HR 94 | Wt 196.0 lb

## 2018-03-16 DIAGNOSIS — Z0000 Encounter for general adult medical examination without abnormal findings: Secondary | ICD-10-CM | POA: Diagnosis not present

## 2018-03-16 DIAGNOSIS — R87810 Cervical high risk human papillomavirus (HPV) DNA test positive: Secondary | ICD-10-CM | POA: Diagnosis not present

## 2018-03-16 DIAGNOSIS — Z124 Encounter for screening for malignant neoplasm of cervix: Secondary | ICD-10-CM | POA: Diagnosis not present

## 2018-03-16 DIAGNOSIS — Z1151 Encounter for screening for human papillomavirus (HPV): Secondary | ICD-10-CM | POA: Insufficient documentation

## 2018-03-16 DIAGNOSIS — Z1239 Encounter for other screening for malignant neoplasm of breast: Secondary | ICD-10-CM

## 2018-03-16 DIAGNOSIS — Z01419 Encounter for gynecological examination (general) (routine) without abnormal findings: Secondary | ICD-10-CM | POA: Insufficient documentation

## 2018-03-16 NOTE — Patient Instructions (Signed)
[facility] [address] [place] [address]  [facility]  [address] [place] [address]  Phone: [phone]  

## 2018-03-16 NOTE — Progress Notes (Signed)
Gynecology Annual Exam   PCP: Patient, No Pcp Per  Chief Complaint:  Chief Complaint  Patient presents with  . Gynecologic Exam    History of Present Illness: Patient is a 38 y.o. G2P2 presents for annual exam. The patient has no complaints today.   LMP: Patient's last menstrual period was 02/19/2018. Average Interval: regular, 28 days Duration of flow: 5 days Heavy Menses: no Clots: no Intermenstrual Bleeding: no Postcoital Bleeding: no Dysmenorrhea: no  The patient is sexually active. She currently uses tubal ligation for contraception. She denies dyspareunia.  The patient does perform self breast exams.  There is notable family history of breast or ovarian cancer in her family.  The patient wears seatbelts: yes.   The patient has regular exercise: not asked.    The patient denies current symptoms of depression.    Review of Systems: ROS  Past Medical History:  Past Medical History:  Diagnosis Date  . BRCA negative   . Hypertension     Past Surgical History:  Past Surgical History:  Procedure Laterality Date  . BUNIONECTOMY    . CESAREAN SECTION    . DILATION AND CURETTAGE OF UTERUS  2014  . TUBAL LIGATION  2014    Gynecologic History:  Patient's last menstrual period was 02/19/2018. Contraception: tubal ligation Last Pap: Results were:02/15/2017 ASCUS with NEGATIVE high risk HPV  08/19/2015 NIL HPV negative Obstetric History: G2P2  Family History:  Family History  Problem Relation Age of Onset  . Breast cancer Mother 67  . Lung cancer Maternal Uncle 78  . Liver cancer Maternal Uncle 34  . Pancreatic cancer Maternal Uncle 60  . Breast cancer Paternal Aunt 37  . Breast cancer Paternal Grandmother     Social History:  Social History   Socioeconomic History  . Marital status: Married    Spouse name: Not on file  . Number of children: Not on file  . Years of education: Not on file  . Highest education level: Not on file  Occupational History   . Not on file  Social Needs  . Financial resource strain: Not on file  . Food insecurity:    Worry: Not on file    Inability: Not on file  . Transportation needs:    Medical: Not on file    Non-medical: Not on file  Tobacco Use  . Smoking status: Never Smoker  . Smokeless tobacco: Never Used  Substance and Sexual Activity  . Alcohol use: Yes  . Drug use: No  . Sexual activity: Yes    Partners: Male    Birth control/protection: Surgical    Comment: Tubal ligation  Lifestyle  . Physical activity:    Days per week: 0 days    Minutes per session: 0 min  . Stress: Not at all  Relationships  . Social connections:    Talks on phone: More than three times a week    Gets together: Once a week    Attends religious service: Never    Active member of club or organization: No    Attends meetings of clubs or organizations: Never    Relationship status: Married  . Intimate partner violence:    Fear of current or ex partner: No    Emotionally abused: No    Physically abused: No    Forced sexual activity: No  Other Topics Concern  . Not on file  Social History Narrative  . Not on file    Allergies:  No  Known Allergies  Medications: Prior to Admission medications   Medication Sig Start Date End Date Taking? Authorizing Provider  lisinopril (PRINIVIL,ZESTRIL) 5 MG tablet Take 1 tablet (5 mg total) by mouth daily. 04/18/17  Yes Malachy Mood, MD    Physical Exam Vitals: Blood pressure 128/82, pulse 94, weight 196 lb (88.9 kg), last menstrual period 02/19/2018.  General: NAD HEENT: normocephalic, anicteric Thyroid: no enlargement, no palpable nodules Pulmonary: No increased work of breathing, CTAB Cardiovascular: RRR, distal pulses 2+ Breast: Breast symmetrical, no tenderness, no palpable nodules or masses, no skin or nipple retraction present, no nipple discharge.  No axillary or supraclavicular lymphadenopathy. Abdomen: NABS, soft, non-tender, non-distended.  Umbilicus  without lesions.  No hepatomegaly, splenomegaly or masses palpable. No evidence of hernia  Genitourinary:  External: Normal external female genitalia.  Normal urethral meatus, normal Bartholin's and Skene's glands.    Vagina: Normal vaginal mucosa, no evidence of prolapse.    Cervix: Grossly normal in appearance, no bleeding  Uterus: Non-enlarged, mobile, normal contour.  No CMT  Adnexa: ovaries non-enlarged, no adnexal masses  Rectal: deferred  Lymphatic: no evidence of inguinal lymphadenopathy Extremities: no edema, erythema, or tenderness Neurologic: Grossly intact Psychiatric: mood appropriate, affect full  Female chaperone present for pelvic and breast  portions of the physical exam    Assessment: 38 y.o. G2P2 routine annual exam  Plan: Problem List Items Addressed This Visit    None    Visit Diagnoses    Screening for malignant neoplasm of cervix    -  Primary   Relevant Orders   MM 3D SCREEN BREAST BILATERAL   Encounter for gynecological examination without abnormal finding       Breast screening       Relevant Orders   Cytology - PAP      1) STI screening  was notoffered and therefore not obtained  2)  ASCCP guidelines and rational discussed.  Patient opts for yearly screening interval  3) Contraception - the patient is currently using  tubal ligation.  She is happy with her current form of contraception and plans to continue  4) Routine healthcare maintenance including cholesterol, diabetes screening discussed managed by PCP  5) Return in about 1 year (around 03/17/2019) for annual.   Malachy Mood, MD, [facility], [place]

## 2018-03-21 LAB — CYTOLOGY - PAP
Adequacy: ABSENT
DIAGNOSIS: NEGATIVE
HPV: DETECTED — AB

## 2018-03-29 ENCOUNTER — Telehealth: Payer: Self-pay

## 2018-03-29 NOTE — Telephone Encounter (Signed)
Pt calling for 'anal' results.  [phone]

## 2018-03-29 NOTE — Telephone Encounter (Signed)
Patient is calling for labs results. Please advise. 

## 2018-03-29 NOTE — Telephone Encounter (Signed)
Pretty sure patient meant annual results, not anal. Please advise

## 2018-04-01 DIAGNOSIS — H6981 Other specified disorders of Eustachian tube, right ear: Secondary | ICD-10-CM | POA: Diagnosis not present

## 2018-04-01 DIAGNOSIS — H6501 Acute serous otitis media, right ear: Secondary | ICD-10-CM | POA: Diagnosis not present

## 2018-04-11 NOTE — Telephone Encounter (Signed)
AMS spoke c pt via MyChart.

## 2018-05-08 DIAGNOSIS — K625 Hemorrhage of anus and rectum: Secondary | ICD-10-CM | POA: Diagnosis not present

## 2018-05-08 DIAGNOSIS — R300 Dysuria: Secondary | ICD-10-CM | POA: Diagnosis not present

## 2018-05-17 DIAGNOSIS — D649 Anemia, unspecified: Secondary | ICD-10-CM | POA: Diagnosis not present

## 2018-05-17 DIAGNOSIS — K625 Hemorrhage of anus and rectum: Secondary | ICD-10-CM | POA: Diagnosis not present

## 2018-05-17 DIAGNOSIS — K644 Residual hemorrhoidal skin tags: Secondary | ICD-10-CM | POA: Diagnosis not present

## 2018-05-17 DIAGNOSIS — I10 Essential (primary) hypertension: Secondary | ICD-10-CM | POA: Diagnosis not present

## 2018-05-22 DIAGNOSIS — H00015 Hordeolum externum left lower eyelid: Secondary | ICD-10-CM | POA: Diagnosis not present

## 2018-06-05 DIAGNOSIS — Z6834 Body mass index (BMI) 34.0-34.9, adult: Secondary | ICD-10-CM | POA: Diagnosis not present

## 2018-06-05 DIAGNOSIS — I10 Essential (primary) hypertension: Secondary | ICD-10-CM | POA: Diagnosis not present

## 2018-06-05 DIAGNOSIS — Z0000 Encounter for general adult medical examination without abnormal findings: Secondary | ICD-10-CM | POA: Diagnosis not present

## 2018-06-05 DIAGNOSIS — D649 Anemia, unspecified: Secondary | ICD-10-CM | POA: Diagnosis not present

## 2018-07-02 DIAGNOSIS — H6691 Otitis media, unspecified, right ear: Secondary | ICD-10-CM | POA: Diagnosis not present

## 2018-07-02 DIAGNOSIS — K649 Unspecified hemorrhoids: Secondary | ICD-10-CM | POA: Diagnosis not present

## 2018-07-09 DIAGNOSIS — K648 Other hemorrhoids: Secondary | ICD-10-CM | POA: Diagnosis not present

## 2018-07-09 DIAGNOSIS — I10 Essential (primary) hypertension: Secondary | ICD-10-CM | POA: Diagnosis not present

## 2018-07-09 DIAGNOSIS — Z79899 Other long term (current) drug therapy: Secondary | ICD-10-CM | POA: Diagnosis not present

## 2018-07-09 DIAGNOSIS — Z800 Family history of malignant neoplasm of digestive organs: Secondary | ICD-10-CM | POA: Diagnosis not present

## 2018-07-09 DIAGNOSIS — K600 Acute anal fissure: Secondary | ICD-10-CM | POA: Diagnosis not present

## 2018-07-09 DIAGNOSIS — Z13220 Encounter for screening for lipoid disorders: Secondary | ICD-10-CM | POA: Diagnosis not present

## 2018-07-12 DIAGNOSIS — H6691 Otitis media, unspecified, right ear: Secondary | ICD-10-CM | POA: Diagnosis not present

## 2018-07-17 DIAGNOSIS — H9041 Sensorineural hearing loss, unilateral, right ear, with unrestricted hearing on the contralateral side: Secondary | ICD-10-CM | POA: Diagnosis not present

## 2018-09-06 DIAGNOSIS — D649 Anemia, unspecified: Secondary | ICD-10-CM | POA: Diagnosis not present

## 2018-09-06 DIAGNOSIS — E669 Obesity, unspecified: Secondary | ICD-10-CM | POA: Diagnosis not present

## 2018-09-06 DIAGNOSIS — I10 Essential (primary) hypertension: Secondary | ICD-10-CM | POA: Diagnosis not present

## 2019-01-02 ENCOUNTER — Telehealth: Payer: Self-pay

## 2019-01-02 NOTE — Telephone Encounter (Signed)
Pt is calling for a Mammogram order. CB# [phone]

## 2019-01-03 ENCOUNTER — Other Ambulatory Visit: Payer: Self-pay | Admitting: Obstetrics and Gynecology

## 2019-01-03 DIAGNOSIS — Z1239 Encounter for other screening for malignant neoplasm of breast: Secondary | ICD-10-CM

## 2019-01-03 NOTE — Telephone Encounter (Signed)
Order is in.

## 2019-01-04 NOTE — Telephone Encounter (Signed)
Pt aware.

## 2019-02-17 DIAGNOSIS — B9689 Other specified bacterial agents as the cause of diseases classified elsewhere: Secondary | ICD-10-CM | POA: Diagnosis not present

## 2019-02-17 DIAGNOSIS — H6980 Other specified disorders of Eustachian tube, unspecified ear: Secondary | ICD-10-CM | POA: Diagnosis not present

## 2019-02-17 DIAGNOSIS — J019 Acute sinusitis, unspecified: Secondary | ICD-10-CM | POA: Diagnosis not present

## 2019-02-17 DIAGNOSIS — H9201 Otalgia, right ear: Secondary | ICD-10-CM | POA: Diagnosis not present

## 2019-04-04 ENCOUNTER — Ambulatory Visit (INDEPENDENT_AMBULATORY_CARE_PROVIDER_SITE_OTHER): Payer: BC Managed Care – PPO | Admitting: Family Medicine

## 2019-04-04 ENCOUNTER — Other Ambulatory Visit: Payer: Self-pay

## 2019-04-04 ENCOUNTER — Encounter (INDEPENDENT_AMBULATORY_CARE_PROVIDER_SITE_OTHER): Payer: Self-pay | Admitting: Family Medicine

## 2019-04-04 VITALS — BP 133/86 | HR 67 | Temp 98.1°F | Ht 62.0 in | Wt 203.0 lb

## 2019-04-04 DIAGNOSIS — I10 Essential (primary) hypertension: Secondary | ICD-10-CM | POA: Diagnosis not present

## 2019-04-04 DIAGNOSIS — R5383 Other fatigue: Secondary | ICD-10-CM

## 2019-04-04 DIAGNOSIS — Z9189 Other specified personal risk factors, not elsewhere classified: Secondary | ICD-10-CM | POA: Diagnosis not present

## 2019-04-04 DIAGNOSIS — R739 Hyperglycemia, unspecified: Secondary | ICD-10-CM | POA: Diagnosis not present

## 2019-04-04 DIAGNOSIS — E66812 Obesity, class 2: Secondary | ICD-10-CM

## 2019-04-04 DIAGNOSIS — Z1331 Encounter for screening for depression: Secondary | ICD-10-CM

## 2019-04-04 DIAGNOSIS — R0602 Shortness of breath: Secondary | ICD-10-CM | POA: Diagnosis not present

## 2019-04-04 DIAGNOSIS — D508 Other iron deficiency anemias: Secondary | ICD-10-CM

## 2019-04-04 DIAGNOSIS — R7989 Other specified abnormal findings of blood chemistry: Secondary | ICD-10-CM

## 2019-04-04 DIAGNOSIS — Z0289 Encounter for other administrative examinations: Secondary | ICD-10-CM

## 2019-04-04 DIAGNOSIS — Z6837 Body mass index (BMI) 37.0-37.9, adult: Secondary | ICD-10-CM

## 2019-04-05 LAB — VITAMIN D 25 HYDROXY (VIT D DEFICIENCY, FRACTURES): Vit D, 25-Hydroxy: 9.7 ng/mL — ABNORMAL LOW (ref 30.0–100.0)

## 2019-04-05 LAB — COMPREHENSIVE METABOLIC PANEL
ALT: 27 IU/L (ref 0–32)
AST: 23 IU/L (ref 0–40)
Albumin/Globulin Ratio: 1.7 (ref 1.2–2.2)
Albumin: 4.3 g/dL (ref 3.8–4.8)
Alkaline Phosphatase: 68 IU/L (ref 39–117)
BUN/Creatinine Ratio: 8 — ABNORMAL LOW (ref 9–23)
BUN: 5 mg/dL — ABNORMAL LOW (ref 6–24)
Bilirubin Total: 0.3 mg/dL (ref 0.0–1.2)
CO2: 23 mmol/L (ref 20–29)
Calcium: 9.3 mg/dL (ref 8.7–10.2)
Chloride: 103 mmol/L (ref 96–106)
Creatinine, Ser: 0.66 mg/dL (ref 0.57–1.00)
GFR calc Af Amer: 128 mL/min/{1.73_m2} (ref >59)
GFR calc non Af Amer: 111 mL/min/{1.73_m2} (ref >59)
Globulin, Total: 2.6 g/dL (ref 1.5–4.5)
Glucose: 84 mg/dL (ref 65–99)
Potassium: 4.5 mmol/L (ref 3.5–5.2)
Sodium: 139 mmol/L (ref 134–144)
Total Protein: 6.9 g/dL (ref 6.0–8.5)

## 2019-04-05 LAB — HEMOGLOBIN A1C
Est. average glucose Bld gHb Est-mCnc: 111 mg/dL
Hgb A1c MFr Bld: 5.5 % (ref 4.8–5.6)

## 2019-04-05 LAB — CBC WITH DIFFERENTIAL/PLATELET
Basophils Absolute: 0.1 10*3/uL (ref 0.0–0.2)
Basos: 1 %
EOS (ABSOLUTE): 0.3 10*3/uL (ref 0.0–0.4)
Eos: 4 %
Hematocrit: 41.6 % (ref 34.0–46.6)
Hemoglobin: 13.7 g/dL (ref 11.1–15.9)
Immature Grans (Abs): 0 10*3/uL (ref 0.0–0.1)
Immature Granulocytes: 0 %
Lymphocytes Absolute: 1.9 10*3/uL (ref 0.7–3.1)
Lymphs: 25 %
MCH: 29.1 pg (ref 26.6–33.0)
MCHC: 32.9 g/dL (ref 31.5–35.7)
MCV: 89 fL (ref 79–97)
Monocytes Absolute: 0.3 10*3/uL (ref 0.1–0.9)
Monocytes: 4 %
Neutrophils Absolute: 5 10*3/uL (ref 1.4–7.0)
Neutrophils: 66 %
Platelets: 307 10*3/uL (ref 150–450)
RBC: 4.7 x10E6/uL (ref 3.77–5.28)
RDW: 13 % (ref 11.7–15.4)
WBC: 7.6 10*3/uL (ref 3.4–10.8)

## 2019-04-05 LAB — FOLATE: Folate: 10.1 ng/mL (ref >3.0)

## 2019-04-05 LAB — LIPID PANEL WITH LDL/HDL RATIO
Cholesterol, Total: 187 mg/dL (ref 100–199)
HDL: 40 mg/dL (ref >39)
LDL Chol Calc (NIH): 113 mg/dL — ABNORMAL HIGH (ref 0–99)
LDL/HDL Ratio: 2.8 ratio (ref 0.0–3.2)
Triglycerides: 191 mg/dL — ABNORMAL HIGH (ref 0–149)
VLDL Cholesterol Cal: 34 mg/dL (ref 5–40)

## 2019-04-05 LAB — TSH: TSH: 1.44 u[IU]/mL (ref 0.450–4.500)

## 2019-04-05 LAB — INSULIN, RANDOM: INSULIN: 14.7 u[IU]/mL (ref 2.6–24.9)

## 2019-04-05 LAB — VITAMIN B12: Vitamin B-12: 425 pg/mL (ref 232–1245)

## 2019-04-05 LAB — T3: T3, Total: 131 ng/dL (ref 71–180)

## 2019-04-05 LAB — T4, FREE: Free T4: 1.05 ng/dL (ref 0.82–1.77)

## 2019-04-08 NOTE — Progress Notes (Signed)
Chief Complaint:   OBESITY Donna Peters (MR# [id number]) is a 39 y.o. female who presents for evaluation and treatment of obesity and related comorbidities. Current BMI is Body mass index is 37.13 kg/m. Donna Peters has been struggling with her weight for many years and has been unsuccessful in either losing weight, maintaining weight loss, or reaching her healthy weight goal.  Donna Peters is currently in the action stage of change and ready to dedicate time achieving and maintaining a healthier weight. Donna Peters is interested in becoming our patient and working on intensive lifestyle modifications including (but not limited to) diet and exercise for weight loss.  Donna Peters's habits were reviewed today and are as follows: Her family eats meals together, she thinks her family will eat healthier with her, her desired weight loss is 63 lbs, she started gaining weight after her 20's, her heaviest weight ever was 203 pounds, she is a picky eater and doesn't like to eat healthier foods, she has significant food cravings issues, she snacks frequently in the evenings, she frequently makes poor food choices, she frequently eats larger portions than normal and she struggles with emotional eating.  Depression Screen Donna Peters's Food and Mood (modified PHQ-9) score was 3.  Depression screen PHQ 2/9 04/04/2019  Decreased Interest 1  Down, Depressed, Hopeless 0  PHQ - 2 Score 1  Altered sleeping 1  Tired, decreased energy 1  Change in appetite 0  Feeling bad or failure about yourself  0  Trouble concentrating 0  Moving slowly or fidgety/restless 0  Suicidal thoughts 0  PHQ-9 Score 3  Difficult doing work/chores Not difficult at all   Subjective:   1. Other fatigue Donna Peters denies daytime somnolence and admits to waking up still tired. Patent has a history of symptoms of daytime fatigue. Donna Peters generally gets 5 hours of sleep per night, and states that she has generally restful sleep. Snoring is present. Apneic  episodes are not present. Epworth Sleepiness Score is 0.  2. Shortness of breath on exertion Donna Peters notes increasing shortness of breath with exercising and seems to be worsening over time with weight gain. She notes getting out of breath sooner with activity than she used to. This has not gotten worse recently. Donna Peters denies shortness of breath at rest or orthopnea.  3. Essential hypertension Donna Peters is stable on lisinopril. She wants to attempt to improve with diet and weight loss.  4. Other iron deficiency anemia Donna Peters thinks she was anemic in the past. She is still on an iron supplement, and she notes fatigue.  5. Hyperglycemia Donna Peters has a history of elevated fasting blood sugars in the past. She has no recent labs.  6. Elevated LFTs Donna Peters has a history of nephroliths, and she is status post cholecystectomy in 2014. She has no recent labs in Epic. She denies further abdominal pain.    7. At risk for diabetes mellitus Donna Peters is at higher than average risk for developing diabetes due to her obesity.   Assessment/Plan:   1. Other fatigue Donna Peters does feel that her weight is causing her energy to be lower than it should be. Fatigue may be related to obesity, depression or many other causes. Labs will be ordered, and in the meanwhile, Donna Peters will focus on self care including making healthy food choices, increasing physical activity and focusing on stress reduction.  - EKG 12-Lead - Vitamin B12 - VITAMIN D 25 Hydroxy (Vit-D Deficiency, Fractures) - T3 - T4, free - TSH - Folate  2.  Shortness of breath on exertion Donna Peters does feel that she gets out of breath more easily that she used to when she exercises. Donna Peters's shortness of breath appears to be obesity related and exercise induced. She has agreed to work on weight loss and gradually increase exercise to treat her exercise induced shortness of breath. Will continue to monitor closely.  3. Essential hypertension Donna Peters will start  her diet, and she will work on healthy weight loss and exercise to improve blood pressure control. We will watch for signs of hypotension as she continues her lifestyle modifications. We will check labs today.  4. Other iron deficiency anemia We will check labs today. Orders and follow up as documented in patient record.   - CBC with Differential/Platelet  5. Hyperglycemia Fasting labs will be obtained today and results with be discussed with Donna Peters in 2 weeks at her follow up visit. In the meanwhile Donna Peters was started on a lower simple carbohydrate diet and will work on weight loss efforts.  - Comprehensive metabolic panel - Hemoglobin A1c - Insulin, random  6. Elevated LFTs We discussed the likely diagnosis of non-alcoholic fatty liver disease today and how this condition is obesity related. Donna Peters was educated the importance of weight loss. Donna Peters agreed to continue with her weight loss efforts with healthier diet and exercise as an essential part of her treatment plan. We will check labs today.  - Lipid Panel With LDL/HDL Ratio  7. Depression screening Donna Peters had a positive depression screening. Depression is commonly associated with obesity and often results in emotional eating behaviors. We will monitor this closely and work on CBT to help improve the non-hunger eating patterns. Referral to Psychology may be required if no improvement is seen as she continues in our clinic.  8. At risk for diabetes mellitus Donna Peters was given approximately 30 minutes of diabetes prevention education and counseling today. We discussed intensive lifestyle modifications today with an emphasis on weight loss as well as increasing exercise and decreasing simple carbohydrates in her diet. We also reviewed medication options with an emphasis on risk versus benefit of those discussed.   Repetitive spaced learning was employed today to elicit superior memory formation and behavioral change.  9. Class 2 severe  obesity with serious comorbidity and body mass index (BMI) of 37.0 to 37.9 in adult, unspecified obesity type Donna Peters) Donna Peters is currently in the action stage of change and her goal is to continue with weight loss efforts. I recommend Jaymarie begin the structured treatment plan as follows:  She has agreed to the Category 2 Plan.  Exercise goals: No exercise has been prescribed at this time.   Behavioral modification strategies: increasing lean protein intake and decreasing eating out.  She was informed of the importance of frequent follow-up visits to maximize her success with intensive lifestyle modifications for her multiple health conditions. She was informed we would discuss her lab results at her next visit unless there is a critical issue that needs to be addressed sooner. Donna Peters agreed to keep her next visit at the agreed upon time to discuss these results.  Objective:   Blood pressure 133/86, pulse 67, temperature 98.1 F (36.7 C), temperature source Oral, height 5\' 2"  (1.575 m), weight 203 lb (92.1 kg), last menstrual period 03/12/2019, SpO2 99 %. Body mass index is 37.13 kg/m.  EKG: Normal sinus rhythm, rate 89 BPM.  Indirect Calorimeter completed today shows a VO2 of 183 and a REE of 1271.  Her calculated basal metabolic rate is  1647 thus her basal metabolic rate is worse than expected.  General: Cooperative, alert, well developed, in no acute distress. HEENT: Conjunctivae and lids unremarkable. Cardiovascular: Regular rhythm.  Lungs: Normal work of breathing. Neurologic: No focal deficits.   Lab Results  Component Value Date   CREATININE 0.66 04/04/2019   BUN 5 (L) 04/04/2019   NA 139 04/04/2019   K 4.5 04/04/2019   CL 103 04/04/2019   CO2 23 04/04/2019   Lab Results  Component Value Date   ALT 27 04/04/2019   AST 23 04/04/2019   ALKPHOS 68 04/04/2019   BILITOT 0.3 04/04/2019   Lab Results  Component Value Date   HGBA1C 5.5 04/04/2019   Lab Results  Component  Value Date   INSULIN 14.7 04/04/2019   Lab Results  Component Value Date   TSH 1.440 04/04/2019   Lab Results  Component Value Date   CHOL 187 04/04/2019   HDL 40 04/04/2019   LDLCALC 113 (H) 04/04/2019   TRIG 191 (H) 04/04/2019   Lab Results  Component Value Date   WBC 7.6 04/04/2019   HGB 13.7 04/04/2019   HCT 41.6 04/04/2019   MCV 89 04/04/2019   PLT 307 04/04/2019   No results found for: IRON, TIBC, FERRITIN  Attestation Statements:   Reviewed by clinician on day of visit: allergies, medications, problem list, medical history, surgical history, family history, social history, and previous encounter notes.   I, Trixie Dredge, am acting as transcriptionist for Dennard Nip, MD.  I have reviewed the above documentation for accuracy and completeness, and I agree with the above. - Dennard Nip, MD

## 2019-04-18 ENCOUNTER — Ambulatory Visit (INDEPENDENT_AMBULATORY_CARE_PROVIDER_SITE_OTHER): Payer: BLUE CROSS/BLUE SHIELD | Admitting: Family Medicine

## 2019-04-25 ENCOUNTER — Other Ambulatory Visit: Payer: Self-pay

## 2019-04-25 ENCOUNTER — Ambulatory Visit (INDEPENDENT_AMBULATORY_CARE_PROVIDER_SITE_OTHER): Payer: BC Managed Care – PPO | Admitting: Family Medicine

## 2019-04-25 ENCOUNTER — Encounter (INDEPENDENT_AMBULATORY_CARE_PROVIDER_SITE_OTHER): Payer: Self-pay | Admitting: Family Medicine

## 2019-04-25 VITALS — BP 134/74 | HR 80 | Temp 98.2°F | Ht 62.0 in | Wt 201.0 lb

## 2019-04-25 DIAGNOSIS — E785 Hyperlipidemia, unspecified: Secondary | ICD-10-CM

## 2019-04-25 DIAGNOSIS — E8881 Metabolic syndrome: Secondary | ICD-10-CM

## 2019-04-25 DIAGNOSIS — Z9189 Other specified personal risk factors, not elsewhere classified: Secondary | ICD-10-CM

## 2019-04-25 DIAGNOSIS — E559 Vitamin D deficiency, unspecified: Secondary | ICD-10-CM | POA: Diagnosis not present

## 2019-04-25 DIAGNOSIS — Z6836 Body mass index (BMI) 36.0-36.9, adult: Secondary | ICD-10-CM

## 2019-04-25 MED ORDER — VITAMIN D (ERGOCALCIFEROL) 1.25 MG (50000 UNIT) PO CAPS: 50000.0000 [IU] | ORAL_CAPSULE | ORAL | 0 refills | Status: DC

## 2019-04-30 ENCOUNTER — Other Ambulatory Visit (INDEPENDENT_AMBULATORY_CARE_PROVIDER_SITE_OTHER): Payer: Self-pay | Admitting: Family Medicine

## 2019-04-30 DIAGNOSIS — E559 Vitamin D deficiency, unspecified: Secondary | ICD-10-CM

## 2019-05-06 NOTE — Progress Notes (Signed)
Chief Complaint:   OBESITY Donna Peters is here to discuss her progress with her obesity treatment plan along with follow-up of her obesity related diagnoses. Donna Peters is on the Category 2 Plan and states she is following her eating plan approximately 50% of the time. Donna Peters states she is exercising 0 minutes 0 times per week.  Today's visit was #: 2 Starting weight: 203 lbs Starting date: 04/04/2019 Today's weight: 201 lbs Today's date: 04/25/2019 Total lbs lost to date: 2 Total lbs lost since last in-office visit: 2  Interim History: Donna Peters did start the plan one week after the last appointment. Donna Peters so far has adhered to the breakfast and lunch, but she hasn't started following dinner yet. Patient had many questions about snacks and the quantity of snacks.  Subjective:   Dyslipidemia Donna Peters has dyslipidemia and she has LDL of 113, HDL of 40 and triglycerides of 191 (LDL in 07/09/18 of 73). ASCVD 10 year risk score is 1.4%. She has been trying to improve her cholesterol levels with intensive lifestyle modification including a low saturated fat diet, exercise and weight loss.   Lab Results  Component Value Date   CHOL 187 04/04/2019   HDL 40 04/04/2019   LDLCALC 113 (H) 04/04/2019   TRIG 191 (H) 04/04/2019   Lab Results  Component Value Date   ALT 27 04/04/2019   AST 23 04/04/2019   ALKPHOS 68 04/04/2019   BILITOT 0.3 04/04/2019   The 10-year ASCVD risk score Donna George DC Jr., Donna al., Donna Peters) is: 1.4%   Values used to calculate the score:     Age: 39 years     Sex: Female     Is Non-Hispanic African American: No     Diabetic: No     Tobacco smoker: No     Systolic Blood Pressure: 134 mmHg     Is BP treated: Yes     HDL Cholesterol: 40 mg/dL     Total Cholesterol: 187 mg/dL  Vitamin D deficiency  Donna Peters's Vitamin D level was 9.7 on 04/04/19. She is not currently taking vit D.   Insulin resistance Donna Peters has a diagnosis of insulin resistance based on her elevated  fasting insulin level >5. Her last Hgb A1c was at 5.5 and last insulin level was at 14.7 (04/04/19). She continues to work on diet and exercise to decrease her risk of diabetes. Donna Peters admits to carb cravings.  Lab Results  Component Value Date   INSULIN 14.7 04/04/2019   Lab Results  Component Value Date   HGBA1C 5.5 04/04/2019   At risk for osteoporosis Donna Peters is at higher risk of osteopenia and osteoporosis due to Vitamin D deficiency.   Assessment/Plan:   Dyslipidemia Cardiovascular risk and specific lipid/LDL goals reviewed.  We discussed several lifestyle modifications today and Mildreth will continue to work on diet, exercise and weight loss efforts. We will repeat labs in 3 months. Orders and follow up as documented in patient record.   Vitamin D deficiency  Low Vitamin D level contributes to fatigue and are associated with obesity, breast, and colon cancer. Velmer agrees to start prescription Vitamin D @50 ,000 IU every week #4 with no refills and she will follow-up for routine testing of Vitamin D, at least 2-3 times per year to avoid over-replacement.  Insulin resistance Nyima will continue to work on weight loss, exercise, and decreasing simple carbohydrates to help decrease the risk of diabetes. We will repeat labs in 3 months. Donna Peters agreed to follow-up  with [place] as directed to closely monitor her progress.  At risk for osteoporosis Donna Peters was given approximately 15 minutes of osteoporosis prevention counseling today. Donna Peters is at risk for osteopenia and osteoporosis due to her Vitamin D deficiency. She was encouraged to take her Vitamin D and follow her higher calcium diet and increase strengthening exercise to help strengthen her bones and decrease her risk of osteopenia and osteoporosis.  Repetitive spaced learning was employed today to elicit superior memory formation and behavioral change.  Class 2 severe obesity with serious comorbidity and body mass index (BMI) of 36.0 to  36.9 in adult, unspecified obesity type [facility]) Donna Peters is currently in the action stage of change. As such, her goal is to continue with weight loss efforts. She has agreed to the Category 2 Plan.   Behavioral modification strategies: increasing lean protein intake, increasing vegetables, meal planning and cooking strategies, keeping healthy foods in the home and planning for success.  Donna Peters has agreed to follow-up with our clinic in 2 weeks. She was informed of the importance of frequent follow-up visits to maximize her success with intensive lifestyle modifications for her multiple health conditions.   Objective:   Blood pressure 134/74, pulse 80, temperature 98.2 F (36.8 C), temperature source Oral, height 5\' 2"  (1.575 m), weight 201 lb (91.2 kg), last menstrual period 04/12/2019, SpO2 97 %. Body mass index is 36.76 kg/m.  General: Cooperative, alert, well developed, in no acute distress. HEENT: Conjunctivae and lids unremarkable. Cardiovascular: Regular rhythm.  Lungs: Normal work of breathing. Neurologic: No focal deficits.   Lab Results  Component Value Date   CREATININE 0.66 04/04/2019   BUN 5 (L) 04/04/2019   NA 139 04/04/2019   K 4.5 04/04/2019   CL 103 04/04/2019   CO2 23 04/04/2019   Lab Results  Component Value Date   ALT 27 04/04/2019   AST 23 04/04/2019   ALKPHOS 68 04/04/2019   BILITOT 0.3 04/04/2019   Lab Results  Component Value Date   HGBA1C 5.5 04/04/2019   Lab Results  Component Value Date   INSULIN 14.7 04/04/2019   Lab Results  Component Value Date   TSH 1.440 04/04/2019   Lab Results  Component Value Date   CHOL 187 04/04/2019   HDL 40 04/04/2019   LDLCALC 113 (H) 04/04/2019   TRIG 191 (H) 04/04/2019   Lab Results  Component Value Date   WBC 7.6 04/04/2019   HGB 13.7 04/04/2019   HCT 41.6 04/04/2019   MCV 89 04/04/2019   PLT 307 04/04/2019   No results found for: IRON, TIBC, FERRITIN    Ref. Range 04/04/2019 12:56  Vitamin D,  25-Hydroxy Latest Ref Range: 30.0 - 100.0 ng/mL 9.7 (L)    Attestation Statements:   Reviewed by clinician on day of visit: allergies, medications, problem list, medical history, surgical history, family history, social history, and previous encounter notes.  I, Doreene Nest, am acting as transcriptionist for Coralie Common, MD.  I have reviewed the above documentation for accuracy and completeness, and I agree with the above. - Ilene Qua, MD

## 2019-05-14 ENCOUNTER — Encounter (INDEPENDENT_AMBULATORY_CARE_PROVIDER_SITE_OTHER): Payer: Self-pay | Admitting: Family Medicine

## 2019-05-14 ENCOUNTER — Other Ambulatory Visit: Payer: Self-pay

## 2019-05-14 ENCOUNTER — Ambulatory Visit (INDEPENDENT_AMBULATORY_CARE_PROVIDER_SITE_OTHER): Payer: BC Managed Care – PPO | Admitting: Family Medicine

## 2019-05-14 VITALS — BP 137/89 | HR 80 | Temp 98.1°F | Ht 62.0 in | Wt 200.0 lb

## 2019-05-14 DIAGNOSIS — Z9189 Other specified personal risk factors, not elsewhere classified: Secondary | ICD-10-CM

## 2019-05-14 DIAGNOSIS — E559 Vitamin D deficiency, unspecified: Secondary | ICD-10-CM | POA: Diagnosis not present

## 2019-05-14 DIAGNOSIS — I10 Essential (primary) hypertension: Secondary | ICD-10-CM | POA: Diagnosis not present

## 2019-05-14 DIAGNOSIS — Z6836 Body mass index (BMI) 36.0-36.9, adult: Secondary | ICD-10-CM

## 2019-05-14 MED ORDER — VITAMIN D (ERGOCALCIFEROL) 1.25 MG (50000 UNIT) PO CAPS: 50000.0000 [IU] | ORAL_CAPSULE | ORAL | 0 refills | Status: AC

## 2019-05-14 NOTE — Progress Notes (Signed)
Chief Complaint:   OBESITY Donna Peters is here to discuss her progress with her obesity treatment plan along with follow-up of her obesity related diagnoses. Donna Peters is on the Category 2 Plan and states she is following her eating plan approximately 30% of the time. Donna Peters states she is doing cardio for 15 minutes 1 times per week.  Today's visit was #: 3 Starting weight: 203 lbs Starting date: 04/04/2019 Today's weight: 200 Today's date: 05/14/2019 Total lbs lost to date: 3 lbs Total lbs lost since last in-office visit: 1 lb  Interim History: Donna Peters voices that she didn't follow the plan asa best as she could.  She has been eating granola bars in the morning.  On 2 days she didn't eat lunch on plan and 3 days she didn't follow dinner.  She tends to eat out if time gets away from her.  She has been having an apple for a snack.  She is currently house hunting, and this is an obstacle for the dinner meal.  Subjective:   1. Vitamin D deficiency Donna Peters's Vitamin D level was 9.7 on 04/04/2019. She is currently taking vit D. She denies nausea, vomiting or muscle weakness.  She endorses fatigue.    2. Essential hypertension Review: taking medications as instructed, no medication side effects noted, no chest pain on exertion, no dyspnea on exertion, no swelling of ankles.  Blood pressure is well controlled on lisinopril 5 mg daily.  BP Readings from Last 3 Encounters:  05/14/19 137/89  04/25/19 134/74  04/04/19 133/86   3. At risk for osteoporosis Donna Peters is at higher risk of osteopenia and osteoporosis due to Vitamin D deficiency.   Assessment/Plan:   1. Vitamin D deficiency Low Vitamin D level contributes to fatigue and are associated with obesity, breast, and colon cancer. She agrees to continue to take prescription Vitamin D @50 ,000 IU every week and will follow-up for routine testing of Vitamin D, at least 2-3 times per year to avoid over-replacement. - Vitamin D, Ergocalciferol,  (DRISDOL) 1.25 MG (50000 UNIT) CAPS capsule; Take 1 capsule (50,000 Units total) by mouth every 7 (seven) days.  Dispense: 4 capsule; Refill: 0  2. Essential hypertension Donna Peters is working on healthy weight loss and exercise to improve blood pressure control. We will watch for signs of hypotension as she continues her lifestyle modifications.  Will follow-up blood pressure at next appointment.  If blood pressure improves, will stop lisinopril.  3. At risk for osteoporosis Donna Peters was given approximately 15 minutes of osteoporosis prevention counseling today. Donna Peters is at risk for osteopenia and osteoporosis due to her Vitamin D deficiency. She was encouraged to take her Vitamin D and follow her higher calcium diet and increase strengthening exercise to help strengthen her bones and decrease her risk of osteopenia and osteoporosis.  Repetitive spaced learning was employed today to elicit superior memory formation and behavioral change.  4. Class 2 severe obesity with serious comorbidity and body mass index (BMI) of 36.0 to 36.9 in adult, unspecified obesity type Donna Peters) Donna Peters is currently in the action stage of change. As such, her goal is to continue with weight loss efforts. She has agreed to the Category 2 Plan and keeping a food journal and adhering to recommended goals of 400-500 calories and 35+ grams of protein at supper.   Exercise goals: No exercise has been prescribed at this time.  Behavioral modification strategies: increasing lean protein intake, increasing vegetables, meal planning and cooking strategies, keeping healthy foods in the  home and planning for success.  Donna Peters has agreed to follow-up with our clinic in 2 weeks. She was informed of the importance of frequent follow-up visits to maximize her success with intensive lifestyle modifications for her multiple health conditions.   Objective:   Blood pressure 137/89, pulse 80, temperature 98.1 F (36.7 C), temperature source Oral,  height 5\' 2"  (1.575 m), weight 200 lb (90.7 kg), last menstrual period 05/09/2019, SpO2 95 %. Body mass index is 36.58 kg/m.  General: Cooperative, alert, well developed, in no acute distress. HEENT: Conjunctivae and lids unremarkable. Cardiovascular: Regular rhythm.  Lungs: Normal work of breathing. Neurologic: No focal deficits.   Lab Results  Component Value Date   CREATININE 0.66 04/04/2019   BUN 5 (L) 04/04/2019   NA 139 04/04/2019   K 4.5 04/04/2019   CL 103 04/04/2019   CO2 23 04/04/2019   Lab Results  Component Value Date   ALT 27 04/04/2019   AST 23 04/04/2019   ALKPHOS 68 04/04/2019   BILITOT 0.3 04/04/2019   Lab Results  Component Value Date   HGBA1C 5.5 04/04/2019   Lab Results  Component Value Date   INSULIN 14.7 04/04/2019   Lab Results  Component Value Date   TSH 1.440 04/04/2019   Lab Results  Component Value Date   CHOL 187 04/04/2019   HDL 40 04/04/2019   LDLCALC 113 (H) 04/04/2019   TRIG 191 (H) 04/04/2019   Lab Results  Component Value Date   WBC 7.6 04/04/2019   HGB 13.7 04/04/2019   HCT 41.6 04/04/2019   MCV 89 04/04/2019   PLT 307 04/04/2019   Attestation Statements:   Reviewed by clinician on day of visit: allergies, medications, problem list, medical history, surgical history, family history, social history, and previous encounter notes.  I, Water quality scientist, CMA, am acting as transcriptionist for Coralie Common, MD.  I have reviewed the above documentation for accuracy and completeness, and I agree with the above. - Ilene Qua, MD

## 2019-05-21 ENCOUNTER — Ambulatory Visit
Admission: RE | Admit: 2019-05-21 | Discharge: 2019-05-21 | Disposition: A | Discharge status: Home / Self Care | Payer: BC Managed Care – PPO | Source: Ambulatory Visit | Attending: Obstetrics and Gynecology | Admitting: Obstetrics and Gynecology

## 2019-05-21 DIAGNOSIS — Z1231 Encounter for screening mammogram for malignant neoplasm of breast: Secondary | ICD-10-CM | POA: Diagnosis not present

## 2019-05-21 DIAGNOSIS — Z1239 Encounter for other screening for malignant neoplasm of breast: Secondary | ICD-10-CM

## 2019-05-28 ENCOUNTER — Ambulatory Visit (INDEPENDENT_AMBULATORY_CARE_PROVIDER_SITE_OTHER): Payer: BC Managed Care – PPO | Admitting: Family Medicine

## 2019-06-27 DIAGNOSIS — K219 Gastro-esophageal reflux disease without esophagitis: Secondary | ICD-10-CM | POA: Diagnosis not present

## 2019-06-27 DIAGNOSIS — Z23 Encounter for immunization: Secondary | ICD-10-CM | POA: Diagnosis not present

## 2019-07-17 DIAGNOSIS — D485 Neoplasm of uncertain behavior of skin: Secondary | ICD-10-CM | POA: Diagnosis not present

## 2019-07-17 DIAGNOSIS — L821 Other seborrheic keratosis: Secondary | ICD-10-CM | POA: Diagnosis not present

## 2019-07-17 DIAGNOSIS — D2361 Other benign neoplasm of skin of right upper limb, including shoulder: Secondary | ICD-10-CM | POA: Diagnosis not present

## 2019-07-17 DIAGNOSIS — L738 Other specified follicular disorders: Secondary | ICD-10-CM | POA: Diagnosis not present

## 2019-07-17 DIAGNOSIS — L811 Chloasma: Secondary | ICD-10-CM | POA: Diagnosis not present

## 2019-07-17 DIAGNOSIS — L720 Epidermal cyst: Secondary | ICD-10-CM | POA: Diagnosis not present

## 2019-11-08 DIAGNOSIS — E669 Obesity, unspecified: Secondary | ICD-10-CM | POA: Diagnosis not present

## 2019-11-08 DIAGNOSIS — Z0000 Encounter for general adult medical examination without abnormal findings: Secondary | ICD-10-CM | POA: Diagnosis not present

## 2019-11-08 DIAGNOSIS — Z79899 Other long term (current) drug therapy: Secondary | ICD-10-CM | POA: Diagnosis not present

## 2019-11-08 DIAGNOSIS — E559 Vitamin D deficiency, unspecified: Secondary | ICD-10-CM | POA: Diagnosis not present

## 2019-11-08 DIAGNOSIS — I10 Essential (primary) hypertension: Secondary | ICD-10-CM | POA: Diagnosis not present

## 2019-11-08 DIAGNOSIS — E781 Pure hyperglyceridemia: Secondary | ICD-10-CM | POA: Diagnosis not present

## 2019-11-08 DIAGNOSIS — D649 Anemia, unspecified: Secondary | ICD-10-CM | POA: Diagnosis not present

## 2019-12-27 DIAGNOSIS — D500 Iron deficiency anemia secondary to blood loss (chronic): Secondary | ICD-10-CM | POA: Diagnosis not present

## 2019-12-27 DIAGNOSIS — I10 Essential (primary) hypertension: Secondary | ICD-10-CM | POA: Diagnosis not present

## 2019-12-27 DIAGNOSIS — E669 Obesity, unspecified: Secondary | ICD-10-CM | POA: Diagnosis not present

## 2020-01-20 DIAGNOSIS — I10 Essential (primary) hypertension: Secondary | ICD-10-CM | POA: Diagnosis not present

## 2020-01-20 DIAGNOSIS — E669 Obesity, unspecified: Secondary | ICD-10-CM | POA: Diagnosis not present

## 2020-01-20 DIAGNOSIS — R0683 Snoring: Secondary | ICD-10-CM | POA: Diagnosis not present

## 2020-01-24 DIAGNOSIS — G4733 Obstructive sleep apnea (adult) (pediatric): Secondary | ICD-10-CM | POA: Diagnosis not present

## 2020-03-14 ENCOUNTER — Other Ambulatory Visit: Payer: BC Managed Care – PPO

## 2020-03-14 ENCOUNTER — Other Ambulatory Visit: Payer: Self-pay

## 2020-03-14 DIAGNOSIS — Z20822 Contact with and (suspected) exposure to covid-19: Secondary | ICD-10-CM

## 2020-03-17 LAB — NOVEL CORONAVIRUS, NAA: SARS-CoV-2, NAA: NOT DETECTED

## 2020-07-06 ENCOUNTER — Other Ambulatory Visit: Payer: Self-pay | Admitting: Internal Medicine

## 2020-07-06 DIAGNOSIS — Z1231 Encounter for screening mammogram for malignant neoplasm of breast: Secondary | ICD-10-CM

## 2020-11-23 ENCOUNTER — Other Ambulatory Visit (HOSPITAL_COMMUNITY)
Admission: RE | Admit: 2020-11-23 | Discharge: 2020-11-23 | Disposition: A | Discharge status: Home / Self Care | Payer: BC Managed Care – PPO | Source: Ambulatory Visit | Attending: Obstetrics and Gynecology | Admitting: Obstetrics and Gynecology

## 2020-11-23 ENCOUNTER — Other Ambulatory Visit: Payer: Self-pay

## 2020-11-23 ENCOUNTER — Encounter: Payer: Self-pay | Admitting: Obstetrics and Gynecology

## 2020-11-23 ENCOUNTER — Ambulatory Visit (INDEPENDENT_AMBULATORY_CARE_PROVIDER_SITE_OTHER): Payer: BC Managed Care – PPO | Admitting: Obstetrics and Gynecology

## 2020-11-23 VITALS — BP 118/74 | HR 78 | Ht 62.0 in | Wt 190.0 lb

## 2020-11-23 DIAGNOSIS — Z01419 Encounter for gynecological examination (general) (routine) without abnormal findings: Secondary | ICD-10-CM | POA: Diagnosis not present

## 2020-11-23 DIAGNOSIS — Z124 Encounter for screening for malignant neoplasm of cervix: Secondary | ICD-10-CM | POA: Diagnosis not present

## 2020-11-23 DIAGNOSIS — Z1239 Encounter for other screening for malignant neoplasm of breast: Secondary | ICD-10-CM | POA: Diagnosis not present

## 2020-11-23 NOTE — Patient Instructions (Signed)
[facility] [address] [place] [address]  [facility] [place]  [address] [place]  [address]  Phone: [phone]  

## 2020-11-23 NOTE — Progress Notes (Signed)
Gynecology Annual Exam  PCP: Marguarite Arbour, MD  Chief Complaint:  Chief Complaint  Patient presents with   Gynecologic Exam    Annual -     History of Present Illness: Patient is a 40 y.o. G2P2 presents for annual exam. The patient has no complaints today.   LMP: Patient's last menstrual period was 11/05/2020. Average Interval: regular, 28 days Duration of flow: 5 days Heavy Menses: no Clots: no Intermenstrual Bleeding: no Postcoital Bleeding: no Dysmenorrhea: no   The patient is sexually active. She currently uses tubal ligation for contraception. She denies dyspareunia.  There is no notable family history of breast or ovarian cancer in her family.  The patient wears seatbelts: yes.   The patient has regular exercise: not asked.    The patient denies current symptoms of depression.    Review of Systems: Review of Systems  Constitutional:  Negative for chills and fever.  HENT:  Negative for congestion.   Respiratory:  Negative for cough and shortness of breath.   Cardiovascular:  Negative for chest pain and palpitations.  Gastrointestinal:  Negative for abdominal pain, constipation, diarrhea, heartburn, nausea and vomiting.  Genitourinary:  Negative for dysuria, frequency and urgency.  Skin:  Negative for itching and rash.  Neurological:  Negative for dizziness and headaches.  Endo/Heme/Allergies:  Negative for polydipsia.  Psychiatric/Behavioral:  Negative for depression.    Past Medical History:  Patient Active Problem List   Diagnosis Date Noted   Essential hypertension 04/19/2017    Past Surgical History:  Past Surgical History:  Procedure Laterality Date   BUNIONECTOMY     CESAREAN SECTION     CHOLECYSTECTOMY     DILATION AND CURETTAGE OF UTERUS  2014   TUBAL LIGATION  2014    Gynecologic History:  Patient's last menstrual period was 11/05/2020. Contraception: tubal ligation Last Pap: Results were:  01/15/2019 NIL and HR HPV+  03/16/2016  ASCUS HPV negative 08/19/2015 NILM HPV negative  Last mammogram: 05/21/2019 Results were: BI-RAD I  Obstetric History: G2P2  Family History:  Family History  Problem Relation Age of Onset   Breast cancer Mother 11   Diabetes Mother    Hypertension Mother    Sleep apnea Mother    Obesity Mother    Lung cancer Maternal Uncle 51   Liver cancer Maternal Uncle 60   Pancreatic cancer Maternal Uncle 60   Breast cancer Paternal Aunt 71   Breast cancer Paternal Grandmother     Social History:  Social History   Socioeconomic History   Marital status: Married    Spouse name: Jeidy Hoerner   Number of children: 2   Years of education: Not on file   Highest education level: Not on file  Occupational History   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  Tobacco Use   Smoking status: Never   Smokeless tobacco: Never  Vaping Use   Vaping Use: Never used  Substance and Sexual Activity   Alcohol use: Yes   Drug use: No   Sexual activity: Yes    Partners: Male    Birth control/protection: Surgical    Comment: Tubal ligation  Other Topics Concern   Not on file  Social History Narrative   Not on file   Social Determinants of Health   Financial Resource Strain: Not on file  Food Insecurity: Not on file  Transportation Needs: Not on file  Physical Activity: Not on file  Stress: Not on file  Social Connections: Not on file  Intimate  Partner Violence: Not on file    Allergies:  Allergies  Allergen Reactions   Oxycodone-Acetaminophen Itching    Medications: Prior to Admission medications   Medication Sig Start Date End Date Taking? Authorizing Provider  acetaminophen (TYLENOL) 500 MG tablet Take 500 mg by mouth every 6 (six) hours as needed.    [provider]  ferrous sulfate 325 (65 FE) MG tablet Take 325 mg by mouth daily with breakfast.    [provider]  lisinopril (PRINIVIL,ZESTRIL) 5 MG tablet Take 1 tablet (5 mg total) by mouth daily. 04/18/17   Vena Austria,  MD  Vitamin D, Ergocalciferol, (DRISDOL) 1.25 MG (50000 UNIT) CAPS capsule Take 1 capsule (50,000 Units total) by mouth every 7 (seven) days. 05/14/19   Langston Reusing, MD    Physical Exam Vitals: Blood pressure 118/74, pulse 78, height 5\' 2"  (1.575 m), weight 190 lb (86.2 kg), last menstrual period 11/05/2020.  General: NAD HEENT: normocephalic, anicteric Thyroid: no enlargement, no palpable nodules Pulmonary: No increased work of breathing, CTAB Cardiovascular: RRR, distal pulses 2+ Breast: Breast symmetrical, no tenderness, no palpable nodules or masses, no skin or nipple retraction present, no nipple discharge.  No axillary or supraclavicular lymphadenopathy. Abdomen: NABS, soft, non-tender, non-distended.  Umbilicus without lesions.  No hepatomegaly, splenomegaly or masses palpable. No evidence of hernia  Genitourinary:  External: Normal external female genitalia.  Normal urethral meatus, normal Bartholin's and Skene's glands.    Vagina: Normal vaginal mucosa, no evidence of prolapse.    Cervix: Grossly normal in appearance, no bleeding  Uterus: Non-enlarged, mobile, normal contour.  No CMT  Adnexa: ovaries non-enlarged, no adnexal masses  Rectal: deferred  Lymphatic: no evidence of inguinal lymphadenopathy Extremities: no edema, erythema, or tenderness Neurologic: Grossly intact Psychiatric: mood appropriate, affect full  Female chaperone present for pelvic and breast  portions of the physical exam    Assessment: 40 y.o. G2P2 routine annual exam  Plan: Problem List Items Addressed This Visit   None Visit Diagnoses     Encounter for gynecological examination without abnormal finding    -  Primary   Screening for malignant neoplasm of cervix       Relevant Orders   Cytology - PAP   Breast screening       Relevant Orders   MM 3D SCREEN BREAST BILATERAL       1) Mammogram - recommend yearly screening mammogram.  Mammogram Was ordered today   2) STI screening   was notoffered and therefore not obtained  3) ASCCP guidelines and rational discussed.   - repeat for NILM HPV positive pap 2020  4) Contraception - the patient is currently using  tubal ligation.  She is happy with her current form of contraception and plans to continue  5) Colonoscopy -- Screening recommended starting at age 38 for average risk individuals, age 9 for individuals deemed at increased risk (including African Americans) and recommended to continue until age 32.  For patient age 13-85 individualized approach is recommended.  Gold standard screening is via colonoscopy, Cologuard screening is an acceptable alternative for patient unwilling or unable to undergo colonoscopy.  "Colorectal cancer screening for average?risk adults: 2018 guideline update from the American Cancer Society"CA: A Cancer Journal for Clinicians: Jul 27, 2016   6) Routine healthcare maintenance including cholesterol, diabetes screening discussed managed by PCP  7) Return in about 1 year (around 11/23/2021) for annual.   11/25/2021, MD, Vena Austria [facility], [facility] 11/23/2020, 8:45 AM

## 2020-11-25 LAB — CYTOLOGY - PAP
Adequacy: ABSENT
Comment: NEGATIVE
Diagnosis: NEGATIVE
High risk HPV: NEGATIVE

## 2020-12-14 ENCOUNTER — Other Ambulatory Visit: Payer: Self-pay

## 2020-12-14 ENCOUNTER — Ambulatory Visit
Admission: RE | Admit: 2020-12-14 | Discharge: 2020-12-14 | Disposition: A | Discharge status: Home / Self Care | Payer: BC Managed Care – PPO | Source: Ambulatory Visit | Attending: Obstetrics and Gynecology | Admitting: Obstetrics and Gynecology

## 2020-12-14 DIAGNOSIS — Z1231 Encounter for screening mammogram for malignant neoplasm of breast: Secondary | ICD-10-CM | POA: Diagnosis present

## 2020-12-14 DIAGNOSIS — Z1239 Encounter for other screening for malignant neoplasm of breast: Secondary | ICD-10-CM

## 2021-01-05 ENCOUNTER — Encounter: Payer: Self-pay | Admitting: Obstetrics and Gynecology

## 2021-01-05 ENCOUNTER — Other Ambulatory Visit: Payer: Self-pay

## 2021-01-05 ENCOUNTER — Ambulatory Visit: Payer: BC Managed Care – PPO | Admitting: Obstetrics and Gynecology

## 2021-01-05 VITALS — BP 116/70 | Ht 62.0 in | Wt 187.0 lb

## 2021-01-05 DIAGNOSIS — N926 Irregular menstruation, unspecified: Secondary | ICD-10-CM

## 2021-01-05 DIAGNOSIS — T192XXA Foreign body in vulva and vagina, initial encounter: Secondary | ICD-10-CM

## 2021-01-05 DIAGNOSIS — Z803 Family history of malignant neoplasm of breast: Secondary | ICD-10-CM | POA: Diagnosis not present

## 2021-01-05 NOTE — Progress Notes (Signed)
Donna Crouch, MD   Chief Complaint  Patient presents with   Vaginal Odor    Abnormal odor during intercourse only, no discharge, itchiness or irritation x 1 week    HPI:      [name] is a 40 y.o. G2P2 whose LMP was Patient's last menstrual period was 12/14/2020 (approximate)., presents today for vaginal odor during sex the past wk. No increased d/c or irritation. Never had this before. Did abx for UTI sx 8/22. No LBP, pelvic pain, fevers, urin sx. No pain/bleeding with sex but husband feels like he is hitting something. No new partners. S/p TL.  Pt also with irreg menses last month. Menses usually monthly, lasting 7 days, mod to heavy flow, no BTB, no dysmen. Had mense Q2 1/2 wks last month, never happened to pt before.  Strong FH breast cancer. Pt had neg BRCA 2/14 but no update testing done.  Neg pap 9/22  Past Medical History:  Diagnosis Date   Anemia    BRCA negative 03/2012   no update testing done   Gallbladder problem    GERD (gastroesophageal reflux disease)    Hypertension     Past Surgical History:  Procedure Laterality Date   BUNIONECTOMY     CESAREAN SECTION     CHOLECYSTECTOMY     DILATION AND CURETTAGE OF UTERUS  2014   TUBAL LIGATION  2014    Family History  Problem Relation Age of Onset   Breast cancer Mother 52   Diabetes Mother    Hypertension Mother    Sleep apnea Mother    Obesity Mother    Lung cancer Maternal Uncle 75   Liver cancer Maternal Uncle 70   Pancreatic cancer Maternal Uncle 51   Breast cancer Paternal Aunt 69   Breast cancer Paternal Grandmother     Social History   Socioeconomic History   Marital status: Married    Spouse name: Donna Peters   Number of children: 2   Years of education: Not on file   Highest education level: Not on file  Occupational History   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  Tobacco Use   Smoking status: Never   Smokeless tobacco: Never  Vaping Use   Vaping Use: Never used  Substance  and Sexual Activity   Alcohol use: Yes   Drug use: No   Sexual activity: Yes    Partners: Male    Birth control/protection: Surgical    Comment: Tubal ligation  Other Topics Concern   Not on file  Social History Narrative   Not on file   Social Determinants of Health   Financial Resource Strain: Not on file  Food Insecurity: Not on file  Transportation Needs: Not on file  Physical Activity: Not on file  Stress: Not on file  Social Connections: Not on file  Intimate Partner Violence: Not on file    Outpatient Medications Prior to Visit  Medication Sig Dispense Refill   acetaminophen (TYLENOL) 500 MG tablet Take 500 mg by mouth every 6 (six) hours as needed.     ferrous sulfate 325 (65 FE) MG tablet Take 325 mg by mouth daily with breakfast.     fluticasone (FLONASE) 50 MCG/ACT nasal spray Place into the nose.     ipratropium (ATROVENT) 0.03 % nasal spray Place 2 sprays into both nostrils 3 (three) times daily.     lisinopril-hydrochlorothiazide (ZESTORETIC) 20-25 MG tablet Take 1 tablet by mouth daily.     pantoprazole (PROTONIX)  40 MG tablet Take 40 mg by mouth daily.     phentermine 30 MG capsule Take by mouth.     topiramate (TOPAMAX) 50 MG tablet Take 1 tablet by mouth 2 (two) times daily.     Vitamin D, Ergocalciferol, (DRISDOL) 1.25 MG (50000 UNIT) CAPS capsule Take 1 capsule (50,000 Units total) by mouth every 7 (seven) days. 4 capsule 0   lisinopril (PRINIVIL,ZESTRIL) 5 MG tablet Take 1 tablet (5 mg total) by mouth daily. 30 tablet 3   No facility-administered medications prior to visit.      ROS:  Review of Systems  Constitutional:  Positive for fatigue. Negative for fever.  Gastrointestinal:  Negative for blood in stool, constipation, diarrhea, nausea and vomiting.  Genitourinary:  Negative for dyspareunia, dysuria, flank pain, frequency, hematuria, urgency, vaginal bleeding, vaginal discharge and vaginal pain.  Musculoskeletal:  Negative for back pain.  Skin:   Negative for rash.  BREAST: No symptoms   OBJECTIVE:   Vitals:  BP 116/70   Ht 5' 2"  (1.575 m)   Wt 187 lb (84.8 kg)   LMP 12/14/2020 (Approximate)   BMI 34.20 kg/m   Physical Exam Vitals reviewed.  Constitutional:      Appearance: She is well-developed.  Pulmonary:     Effort: Pulmonary effort is normal.  Genitourinary:    General: Normal vulva.     Pubic Area: No rash.      Labia:        Right: No rash, tenderness or lesion.        Left: No rash, tenderness or lesion.      Vagina: Foreign body present. No vaginal discharge, erythema or tenderness.     Cervix: Normal.     Uterus: Normal. Not enlarged and not tender.      Adnexa: Right adnexa normal and left adnexa normal.       Right: No mass or tenderness.         Left: No mass or tenderness.       Comments: RETAINED TAMPON IN VAGINA Musculoskeletal:        General: Normal range of motion.     Cervical back: Normal range of motion.  Skin:    General: Skin is warm and dry.  Neurological:     General: No focal deficit present.     Mental Status: She is alert and oriented to person, place, and time.  Psychiatric:        Mood and Affect: Mood normal.        Behavior: Behavior normal.        Thought Content: Thought content normal.        Judgment: Judgment normal.    Assessment/Plan: Retained tampon, initial encounter--tampon removed, neg GYN exam otherwise. S/S TSS discussed, f/u prn.   Irregular menses--for 1 cycle. Cont to follow cycles. Will chck labs/GYN u/s if sx persist. F/u prn.   Family history of breast cancer--MyRisk update testing discussed and handout given. Pt to consider and f/u if desires.     Return if symptoms worsen or fail to improve.  Donna Clemson B. Sharif Rendell, PA-C 01/05/2021 5:03 PM

## 2021-06-28 ENCOUNTER — Ambulatory Visit: Payer: BC Managed Care – PPO | Admitting: Dietician

## 2021-08-11 ENCOUNTER — Ambulatory Visit: Payer: BC Managed Care – PPO | Admitting: Dietician

## 2021-08-25 ENCOUNTER — Encounter: Payer: Self-pay | Admitting: Dietician

## 2021-08-25 NOTE — Progress Notes (Signed)
Have not heard back from patient to reschedule her second missed appointment from 08/11/21. Sent notification to referring provider.

## 2021-10-06 ENCOUNTER — Encounter (INDEPENDENT_AMBULATORY_CARE_PROVIDER_SITE_OTHER): Payer: Self-pay

## 2021-10-27 ENCOUNTER — Other Ambulatory Visit: Payer: Self-pay | Admitting: Internal Medicine

## 2021-10-27 DIAGNOSIS — Z1231 Encounter for screening mammogram for malignant neoplasm of breast: Secondary | ICD-10-CM

## 2021-11-09 IMAGING — MG MM DIGITAL SCREENING BILAT W/ TOMO AND CAD
8 series · 8 of 24 positions shown · non-contrast
Comparison: Previous exam(s).

CLINICAL DATA: Screening.

EXAM:
DIGITAL SCREENING BILATERAL MAMMOGRAM WITH TOMOSYNTHESIS AND CAD
TECHNIQUE: Bilateral screening digital craniocaudal and mediolateral oblique
mammograms were obtained. Bilateral screening digital breast
tomosynthesis was performed. The images were evaluated with
computer-aided detection.

[R MLO synth-2D]
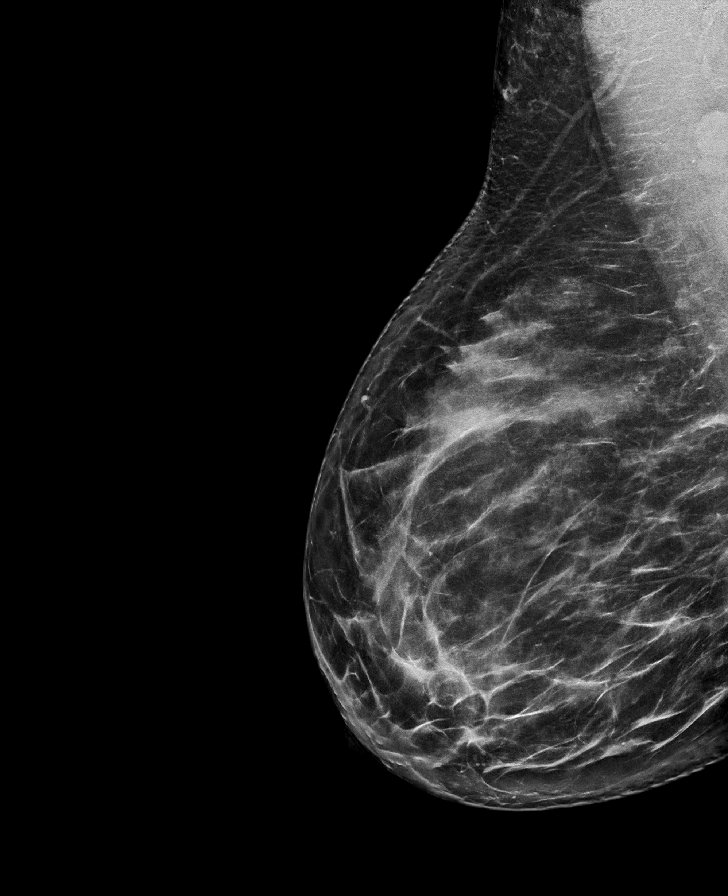

[L CC synth-2D]
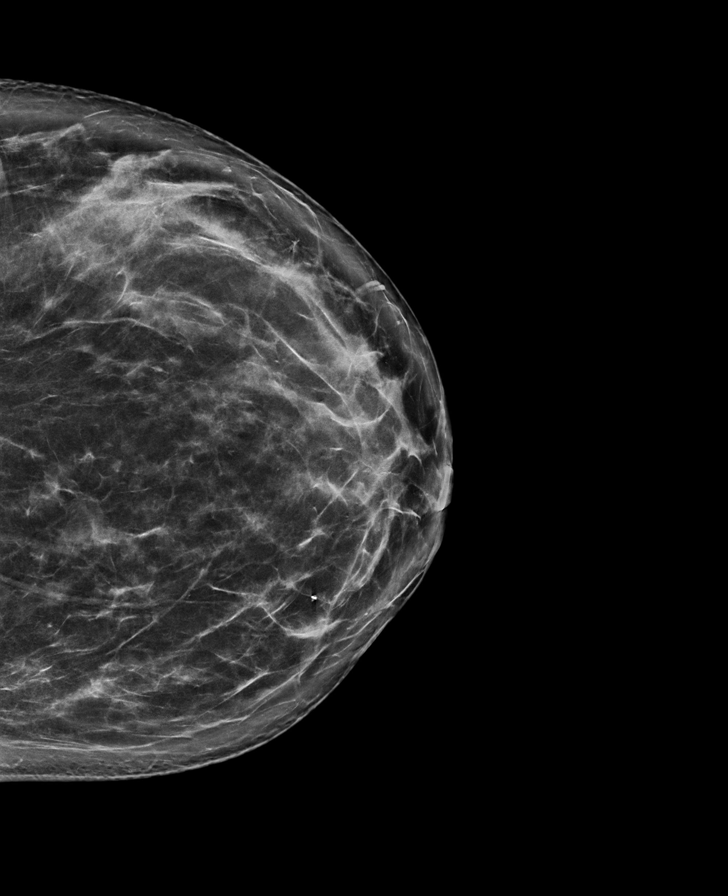

[R CC synth-2D]
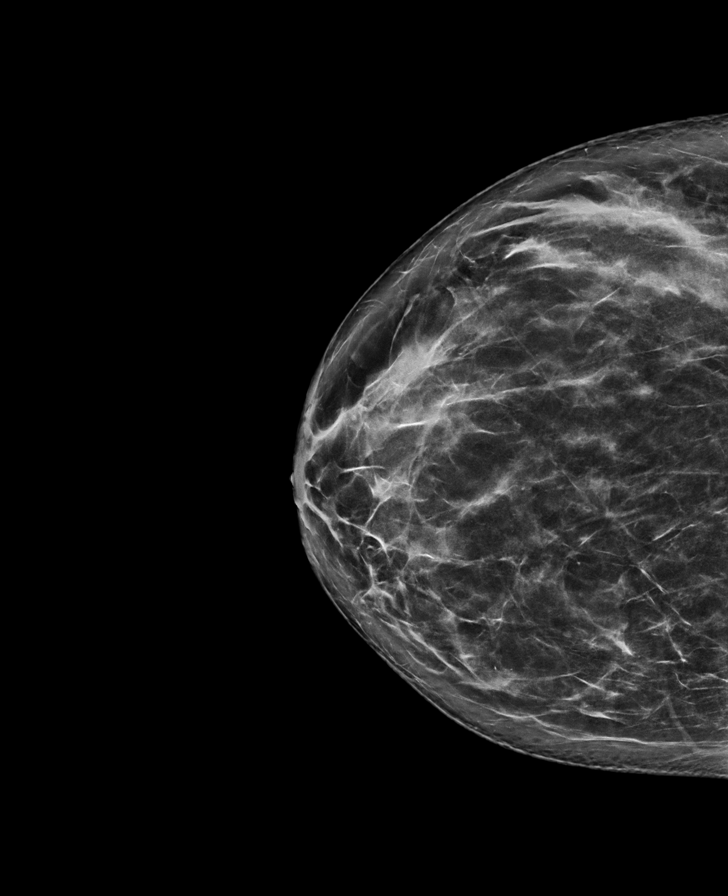

[L MLO synth-2D]
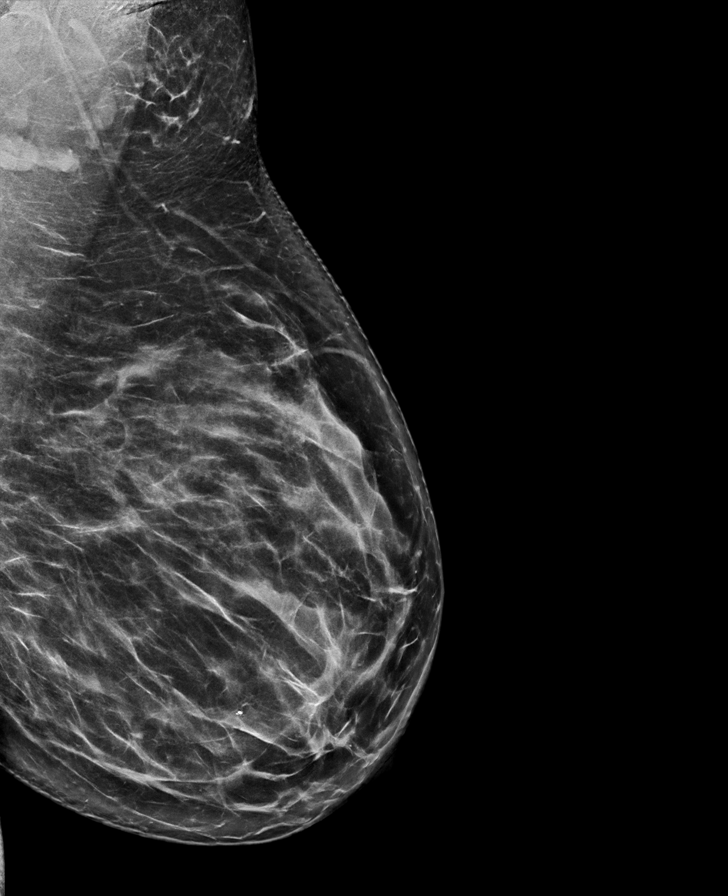

[L CC tomo · tomo slice 47/92.0]
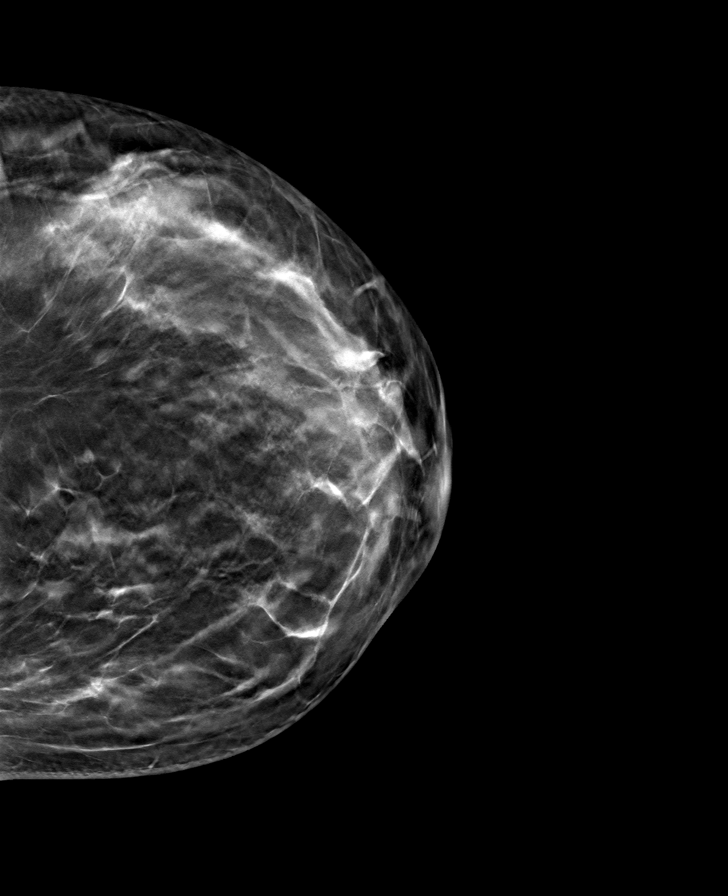

[R MLO tomo · tomo slice 43/85.0]
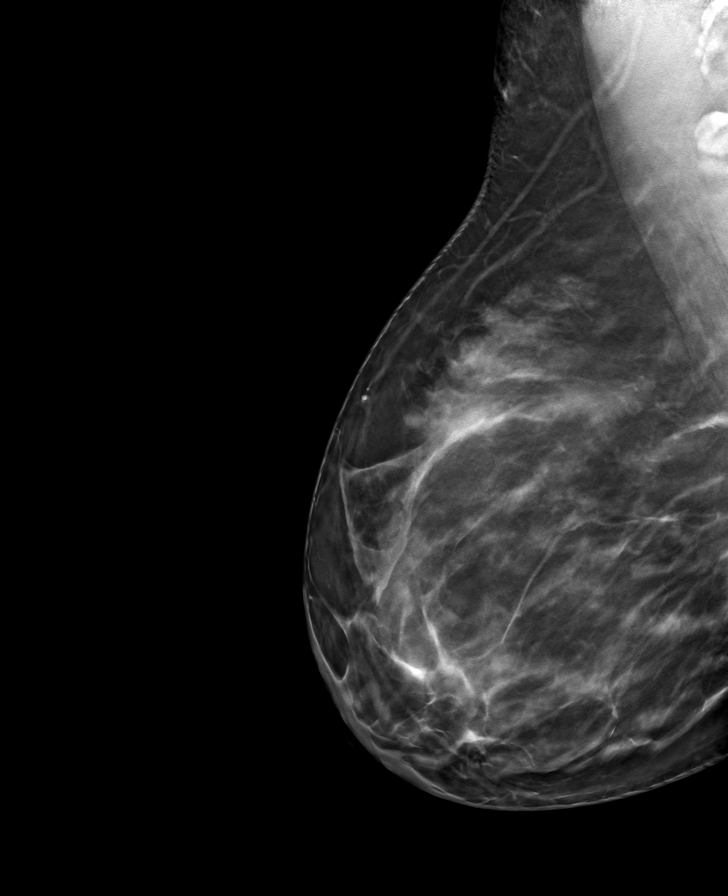

[L MLO tomo · tomo slice 44/87.0]
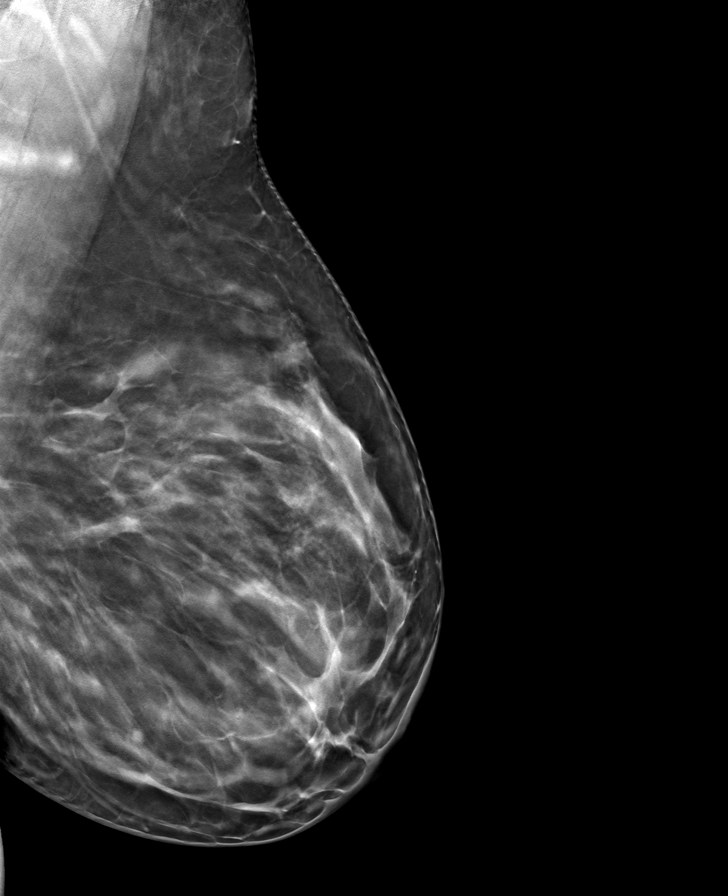

[R CC tomo · tomo slice 45/89.0]
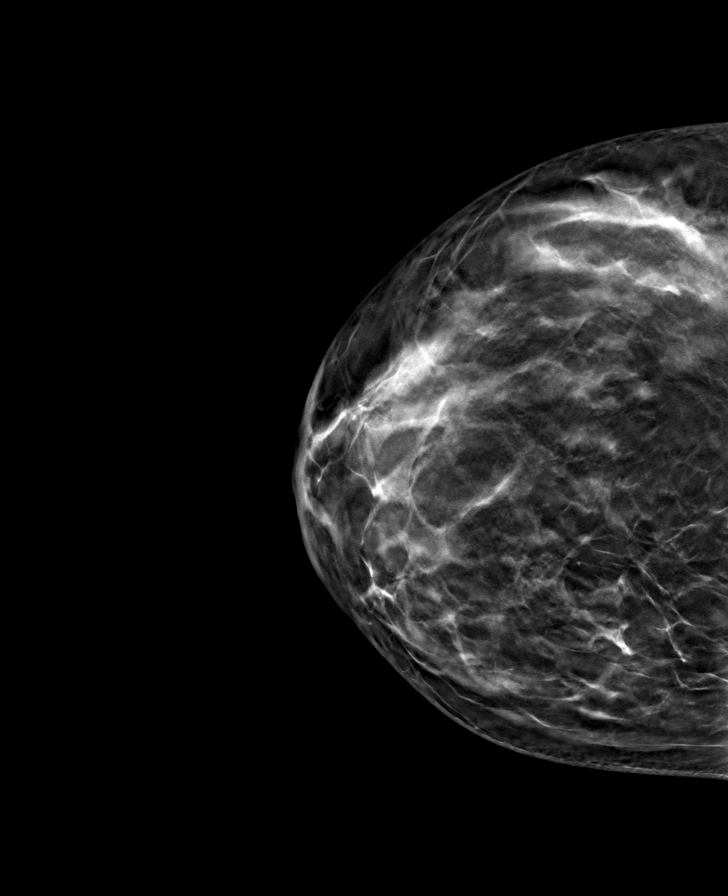

[8 of 24 positions shown; findings below may reference images not displayed]

ACR Breast Density Category c: The breast tissue is heterogeneously
dense, which may obscure small masses.
FINDINGS: There are no findings suspicious for malignancy.
IMPRESSION: No mammographic evidence of malignancy. A result letter of this
screening mammogram will be mailed directly to the patient.

RECOMMENDATION:
Screening mammogram in one year. (Code:Q3-W-BC3)

BI-RADS CATEGORY  1: Negative.

## 2021-11-22 ENCOUNTER — Other Ambulatory Visit (INDEPENDENT_AMBULATORY_CARE_PROVIDER_SITE_OTHER): Payer: Self-pay | Admitting: Family Medicine

## 2021-11-22 DIAGNOSIS — E559 Vitamin D deficiency, unspecified: Secondary | ICD-10-CM

## 2021-12-15 ENCOUNTER — Ambulatory Visit
Admission: RE | Admit: 2021-12-15 | Discharge: 2021-12-15 | Disposition: A | Discharge status: Home / Self Care | Payer: BC Managed Care – PPO | Source: Ambulatory Visit | Attending: Internal Medicine | Admitting: Internal Medicine

## 2021-12-15 DIAGNOSIS — Z1231 Encounter for screening mammogram for malignant neoplasm of breast: Secondary | ICD-10-CM | POA: Diagnosis present

## 2021-12-17 ENCOUNTER — Other Ambulatory Visit: Payer: Self-pay | Admitting: Internal Medicine

## 2021-12-17 DIAGNOSIS — R928 Other abnormal and inconclusive findings on diagnostic imaging of breast: Secondary | ICD-10-CM

## 2021-12-21 ENCOUNTER — Ambulatory Visit
Admission: RE | Admit: 2021-12-21 | Discharge: 2021-12-21 | Disposition: A | Discharge status: Home / Self Care | Payer: BC Managed Care – PPO | Source: Ambulatory Visit | Attending: Internal Medicine | Admitting: Internal Medicine

## 2021-12-21 DIAGNOSIS — R928 Other abnormal and inconclusive findings on diagnostic imaging of breast: Secondary | ICD-10-CM | POA: Diagnosis present

## 2022-01-27 ENCOUNTER — Ambulatory Visit: Payer: Self-pay | Admitting: Urology

## 2022-02-16 ENCOUNTER — Ambulatory Visit: Payer: Self-pay | Admitting: Urology

## 2022-03-18 ENCOUNTER — Ambulatory Visit: Payer: 59 | Admitting: Licensed Practical Nurse

## 2022-03-27 NOTE — Progress Notes (Unsigned)
PCP:  Idelle Crouch, MD   No chief complaint on file.    HPI:      [name] is a 42 y.o. G2P2 whose LMP was No LMP recorded., presents today for her annual examination.  Her menses are regular every 28-30 days, lasting 5 days.  Dysmenorrhea {dysmen:716}. She {does:18564} have intermenstrual bleeding.  Sex activity: {sex active: 315163}.  Last Pap: 11/23/20 Results were: no abnormalities /neg HPV DNA  Hx of STDs: {STD hx:14358}  Last mammogram: 12/21/21  Results were: cat 3 LT breast, repeat mammo due in 6 months There is a FH of breast cancer in her mom, MGM, pat aunt^^^^. There is no FH of ovarian cancer. There eis a FH of pancreatic canc er in her mat uncle.  Pt is BRCA neg $$$, no update testin gdoneThe patient {does:18564} do self-breast exams.  Tobacco use: {tob:20664} Alcohol use: {Alcohol:11675} No drug use.  Exercise: {exercise:31265}  She {does:18564} get adequate calcium and Vitamin D in her diet. Labs with PCP  Patient Active Problem List   Diagnosis Date Noted   Family history of breast cancer 01/05/2021   Essential hypertension 04/19/2017    Past Surgical History:  Procedure Laterality Date   BUNIONECTOMY     CESAREAN SECTION     CHOLECYSTECTOMY     DILATION AND CURETTAGE OF UTERUS  2014   TUBAL LIGATION  2014    Family History  Problem Relation Age of Onset   Breast cancer Mother 58   Diabetes Mother    Hypertension Mother    Sleep apnea Mother    Obesity Mother    Lung cancer Maternal Uncle 15   Liver cancer Maternal Uncle 80   Pancreatic cancer Maternal Uncle 56   Breast cancer Paternal Aunt 109   Breast cancer Paternal Grandmother     Social History   Socioeconomic History   Marital status: Married    Spouse name: Layza Summa   Number of children: 2   Years of education: Not on file   Highest education level: Not on file  Occupational History   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  Tobacco Use   Smoking status: Never    Smokeless tobacco: Never  Vaping Use   Vaping Use: Never used  Substance and Sexual Activity   Alcohol use: Yes   Drug use: No   Sexual activity: Yes    Partners: Male    Birth control/protection: Surgical    Comment: Tubal ligation  Other Topics Concern   Not on file  Social History Narrative   Not on file   Social Determinants of Health   Financial Resource Strain: Not on file  Food Insecurity: Not on file  Transportation Needs: Not on file  Physical Activity: Inactive (02/15/2017)   Exercise Vital Sign    Days of Exercise per Week: 0 days    Minutes of Exercise per Session: 0 min  Stress: No Stress Concern Present (02/15/2017)   [place] of Stress : Not at all  Social Connections: Somewhat Isolated (02/15/2017)   Social Connection and Isolation Panel [NHANES]    Frequency of Communication with Friends and Family: More than three times a week    Frequency of Social Gatherings with Friends and Family: Once a week    Attends Religious Services: Never    Marine scientist or Organizations: No    Attends Archivist Meetings: Never  Marital Status: Married  Human resources officer Violence: Not At Risk (02/15/2017)   Humiliation, Afraid, Rape, and Kick questionnaire    Fear of Current or Ex-Partner: No    Emotionally Abused: No    Physically Abused: No    Sexually Abused: No     Current Outpatient Medications:    acetaminophen (TYLENOL) 500 MG tablet, Take 500 mg by mouth every 6 (six) hours as needed., Disp: , Rfl:    ferrous sulfate 325 (65 FE) MG tablet, Take 325 mg by mouth daily with breakfast., Disp: , Rfl:    fluticasone (FLONASE) 50 MCG/ACT nasal spray, Place into the nose., Disp: , Rfl:    ipratropium (ATROVENT) 0.03 % nasal spray, Place 2 sprays into both nostrils 3 (three) times daily., Disp: , Rfl:    lisinopril-hydrochlorothiazide (ZESTORETIC) 20-25 MG tablet, Take 1  tablet by mouth daily., Disp: , Rfl:    pantoprazole (PROTONIX) 40 MG tablet, Take 40 mg by mouth daily., Disp: , Rfl:    phentermine 30 MG capsule, Take by mouth., Disp: , Rfl:    topiramate (TOPAMAX) 50 MG tablet, Take 1 tablet by mouth 2 (two) times daily., Disp: , Rfl:    Vitamin D, Ergocalciferol, (DRISDOL) 1.25 MG (50000 UNIT) CAPS capsule, Take 1 capsule (50,000 Units total) by mouth every 7 (seven) days., Disp: 4 capsule, Rfl: 0     ROS:  Review of Systems BREAST: No symptoms   Objective: There were no vitals taken for this visit.   OBGyn Exam  Results: No results found for this or any previous visit (from the past 24 hour(s)).  Assessment/Plan: No diagnosis found.  No orders of the defined types were placed in this encounter.            GYN counsel {counseling: 16159}     F/U  No follow-ups on file.  Ommie Degeorge B. Carlena Ruybal, PA-C 03/27/2022 5:55 PM

## 2022-03-28 ENCOUNTER — Encounter: Payer: Self-pay | Admitting: Obstetrics and Gynecology

## 2022-03-28 ENCOUNTER — Other Ambulatory Visit (HOSPITAL_COMMUNITY)
Admission: RE | Admit: 2022-03-28 | Discharge: 2022-03-28 | Disposition: A | Discharge status: Home / Self Care | Payer: 59 | Source: Ambulatory Visit | Attending: Obstetrics and Gynecology | Admitting: Obstetrics and Gynecology

## 2022-03-28 ENCOUNTER — Ambulatory Visit (INDEPENDENT_AMBULATORY_CARE_PROVIDER_SITE_OTHER): Payer: 59 | Admitting: Obstetrics and Gynecology

## 2022-03-28 VITALS — BP 140/80 | Ht 62.0 in | Wt 194.0 lb

## 2022-03-28 DIAGNOSIS — Z1151 Encounter for screening for human papillomavirus (HPV): Secondary | ICD-10-CM | POA: Insufficient documentation

## 2022-03-28 DIAGNOSIS — R87810 Cervical high risk human papillomavirus (HPV) DNA test positive: Secondary | ICD-10-CM | POA: Diagnosis present

## 2022-03-28 DIAGNOSIS — Z124 Encounter for screening for malignant neoplasm of cervix: Secondary | ICD-10-CM | POA: Insufficient documentation

## 2022-03-28 DIAGNOSIS — Z01419 Encounter for gynecological examination (general) (routine) without abnormal findings: Secondary | ICD-10-CM

## 2022-03-28 DIAGNOSIS — Z01411 Encounter for gynecological examination (general) (routine) with abnormal findings: Secondary | ICD-10-CM

## 2022-03-28 DIAGNOSIS — Z803 Family history of malignant neoplasm of breast: Secondary | ICD-10-CM

## 2022-03-28 DIAGNOSIS — N898 Other specified noninflammatory disorders of vagina: Secondary | ICD-10-CM

## 2022-03-28 DIAGNOSIS — R928 Other abnormal and inconclusive findings on diagnostic imaging of breast: Secondary | ICD-10-CM | POA: Diagnosis not present

## 2022-03-28 DIAGNOSIS — Z1231 Encounter for screening mammogram for malignant neoplasm of breast: Secondary | ICD-10-CM

## 2022-03-28 NOTE — Patient Instructions (Signed)
I value your feedback and you entrusting us with your care. If you get a Industry patient survey, I would appreciate you taking the time to let us know about your experience today. Thank you!  [facility]: [phone]      

## 2022-03-31 ENCOUNTER — Telehealth: Payer: Self-pay

## 2022-03-31 DIAGNOSIS — Z9189 Other specified personal risk factors, not elsewhere classified: Secondary | ICD-10-CM

## 2022-03-31 HISTORY — DX: Other specified personal risk factors, not elsewhere classified: Z91.89

## 2022-03-31 LAB — CYTOLOGY - PAP
Comment: NEGATIVE
Diagnosis: NEGATIVE
High risk HPV: NEGATIVE

## 2022-03-31 NOTE — Telephone Encounter (Signed)
Patient inquiring about results of pap smear. She has seen the results in my chart and wanted to clarify the verbiage. Explained all negative/normal.

## 2022-04-11 ENCOUNTER — Encounter: Payer: Self-pay | Admitting: Obstetrics and Gynecology

## 2022-04-28 ENCOUNTER — Telehealth: Payer: Self-pay | Admitting: Obstetrics and Gynecology

## 2022-04-28 NOTE — Telephone Encounter (Signed)
The patient is returning missed call from [place] to go over genetic results. The patient is aware [facility] is out of the office until Monday. Please advise?

## 2022-05-02 NOTE — Telephone Encounter (Signed)
LMTRC

## 2022-05-19 NOTE — Telephone Encounter (Signed)
LMTRC

## 2022-06-09 NOTE — Telephone Encounter (Signed)
Pt aware of neg MyRisk results. IBIS=24.9%/riskscore=51.7%. Pt aware of recommendations of monthly SBE, yearly CBE and mammos, as well as scr breast MRI. Has mammo 4/24; will call for MRI ref prn. Also discussed chemoprevention and prophylactic surgery. Pt to consider and f/u prn.   Patient understands these results only apply to her and her children, and this is not indicative of genetic testing results of her other family members. It is recommended that her other family members have genetic testing done.  Pt also understands negative genetic testing doesn't mean she will never get any of these cancers.   Hard copy mailed to pt. F/u prn.

## 2022-06-24 ENCOUNTER — Ambulatory Visit
Admission: RE | Admit: 2022-06-24 | Discharge: 2022-06-24 | Disposition: A | Discharge status: Home / Self Care | Payer: 59 | Source: Ambulatory Visit | Attending: Obstetrics and Gynecology | Admitting: Obstetrics and Gynecology

## 2022-06-24 DIAGNOSIS — R928 Other abnormal and inconclusive findings on diagnostic imaging of breast: Secondary | ICD-10-CM | POA: Insufficient documentation

## 2022-06-24 DIAGNOSIS — Z1231 Encounter for screening mammogram for malignant neoplasm of breast: Secondary | ICD-10-CM | POA: Insufficient documentation

## 2022-06-24 DIAGNOSIS — Z803 Family history of malignant neoplasm of breast: Secondary | ICD-10-CM | POA: Diagnosis present

## 2022-06-27 ENCOUNTER — Other Ambulatory Visit: Payer: Self-pay | Admitting: Obstetrics and Gynecology

## 2022-06-27 ENCOUNTER — Encounter: Payer: Self-pay | Admitting: Obstetrics and Gynecology

## 2022-06-27 DIAGNOSIS — Z1231 Encounter for screening mammogram for malignant neoplasm of breast: Secondary | ICD-10-CM

## 2022-06-27 DIAGNOSIS — R928 Other abnormal and inconclusive findings on diagnostic imaging of breast: Secondary | ICD-10-CM

## 2022-12-27 ENCOUNTER — Other Ambulatory Visit: Payer: 59

## 2023-01-17 ENCOUNTER — Ambulatory Visit
Admission: RE | Admit: 2023-01-17 | Discharge: 2023-01-17 | Disposition: A | Discharge status: Home / Self Care | Payer: 59 | Source: Ambulatory Visit | Attending: Obstetrics and Gynecology | Admitting: Obstetrics and Gynecology

## 2023-01-17 ENCOUNTER — Other Ambulatory Visit: Payer: Self-pay | Admitting: Obstetrics and Gynecology

## 2023-01-17 DIAGNOSIS — Z1231 Encounter for screening mammogram for malignant neoplasm of breast: Secondary | ICD-10-CM

## 2023-01-17 DIAGNOSIS — R928 Other abnormal and inconclusive findings on diagnostic imaging of breast: Secondary | ICD-10-CM

## 2023-01-18 ENCOUNTER — Encounter: Payer: Self-pay | Admitting: Obstetrics and Gynecology

## 2023-06-28 ENCOUNTER — Encounter: Payer: Self-pay | Admitting: Obstetrics and Gynecology

## 2023-06-28 NOTE — Progress Notes (Signed)
 PCP:  Yehuda Helms, MD   Chief Complaint  Patient presents with   Gynecologic Exam    No concerns     HPI:      [name] is a 43 y.o. G2P2 whose LMP was Patient's last menstrual period was 06/29/2023 (exact date)., presents today for her annual examination.  Her menses are regular every 28-30 days, lasting 7 days, mod to heavy flow, no BTB, no dysmen.  Sex activity: single partner, contraception - tubal ligation. She does have vag dryness/decreased lubrication but hasn't tried lubricants. Sometimes has decreased desire as well  Last Pap: 03/28/22  Results were NILM/neg HPV DNA 11/23/20 Results were: no abnormalities /neg HPV DNA 01/15/2019 NIL and HR HPV+  03/16/2016 ASCUS HPV negative 08/19/2015 NILM HPV negative   Last mammogram: 01/17/23  Results were: cat 3 LT breast (1 yr stability), repeat dx mammo and prn u/s due in 12 months There is a FH of breast cancer in her mom, PGM, and pat aunt. There is no FH of ovarian cancer. There is a FH of pancreatic cancer in her mat uncle.  Pt is BRCA neg 2014/MyRisk update neg 2/24; IBIS=24.9%/riskscore=51.7%. The patient does not do self-breast exams.  Tobacco use: The patient denies current or previous tobacco use. Alcohol use: none No drug use.  Exercise: mod active  She does get some calcium and doing Rx Vitamin D  in her diet. 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 positive 03/28/2022   Family history of breast cancer 01/05/2021   Essential hypertension 04/19/2017    Past Surgical History:  Procedure Laterality Date   BUNIONECTOMY     CESAREAN SECTION     CHOLECYSTECTOMY     DILATION AND CURETTAGE OF UTERUS  2014   TUBAL LIGATION  2014    Family History  Problem Relation Age of Onset   Breast cancer Mother 29   Diabetes Mother    Hypertension Mother    Sleep apnea Mother    Obesity Mother    Lung cancer Maternal Uncle  25   Liver cancer Maternal Uncle 60   Pancreatic cancer Maternal Uncle 63   Breast cancer Paternal Aunt 28   Breast cancer Paternal Grandmother     Social History   Socioeconomic History   Marital status: Married    Spouse name: Jecenia Seavey   Number of children: 2   Years of education: Not on file   Highest education level: Not on file  Occupational History   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  Tobacco Use   Smoking status: Never   Smokeless tobacco: Never  Vaping Use   Vaping status: Never Used  Substance and Sexual Activity   Alcohol use: Yes    Comment: rare   Drug use: No   Sexual activity: Yes    Partners: Male    Birth control/protection: Surgical    Comment: Tubal ligation  Other Topics Concern   Not on file  Social History Narrative   Not on file   Social Drivers of Health   Financial Resource Strain: Low Risk  (08/16/2022)   Received from [facility], [facility]   Overall Financial Resource Strain (CARDIA)    Difficulty of Paying Living Expenses: Not hard at all  Food Insecurity: No Food Insecurity (08/16/2022)   Received from [facility], [facility]   Hunger Vital  Sign    Worried About Programme researcher, broadcasting/film/video in the Last Year: Never true    Ran Out of Food in the Last Year: Never true  Transportation Needs: No Transportation Needs (08/16/2022)   Received from [facility], [facility]   [facility] - Transportation    In the past 12 months, has lack of transportation kept you from medical appointments or from getting medications?: No    Lack of Transportation (Non-Medical): No  Physical Activity: Inactive (02/15/2017)   Exercise Vital Sign    Days of Exercise per Week: 0 days    Minutes of Exercise per Session: 0 min  Stress: No Stress Concern Present (02/15/2017)   [employer] of Occupational [employer] - Occupational Stress Questionnaire    Feeling of  Stress : Not at all  Social Connections: Somewhat Isolated (02/15/2017)   Social Connection and Isolation Panel [NHANES]    Frequency of Communication with Friends and Family: More than three times a week    Frequency of Social Gatherings with Friends and Family: Once a week    Attends Religious Services: Never    Database administrator or Organizations: No    Attends Banker Meetings: Never    Marital Status: Married  Catering manager Violence: Not At Risk (02/15/2017)   Humiliation, Afraid, Rape, and Kick questionnaire    Fear of Current or Ex-Partner: No    Emotionally Abused: No    Physically Abused: No    Sexually Abused: No     Current Outpatient Medications:    acetaminophen (TYLENOL) 500 MG tablet, Take 500 mg by mouth every 6 (six) hours as needed., Disp: , Rfl:    ferrous sulfate 325 (65 FE) MG tablet, Take 325 mg by mouth daily with breakfast., Disp: , Rfl:    lisinopril -hydrochlorothiazide (ZESTORETIC) 20-25 MG tablet, Take 1 tablet by mouth daily., Disp: , Rfl:    pantoprazole (PROTONIX) 40 MG tablet, Take 40 mg by mouth daily., Disp: , Rfl:    phentermine  (ADIPEX-P ) 37.5 MG tablet, Take 37.5 mg by mouth., Disp: , Rfl:    Vitamin D , Ergocalciferol , (DRISDOL ) 1.25 MG (50000 UNIT) CAPS capsule, Take 1 capsule (50,000 Units total) by mouth every 7 (seven) days., Disp: 4 capsule, Rfl: 0     ROS:  Review of Systems  Constitutional:  Negative for fatigue, fever and unexpected weight change.  Respiratory:  Negative for cough, shortness of breath and wheezing.   Cardiovascular:  Negative for chest pain, palpitations and leg swelling.  Gastrointestinal:  Negative for blood in stool, constipation, diarrhea, nausea and vomiting.  Endocrine: Negative for cold intolerance, heat intolerance and polyuria.  Genitourinary:  Negative for dyspareunia, dysuria, flank pain, frequency, genital sores, hematuria, menstrual problem, pelvic pain, urgency, vaginal bleeding,  vaginal discharge and vaginal pain.  Musculoskeletal:  Negative for back pain, joint swelling and myalgias.  Skin:  Negative for rash.  Neurological:  Negative for dizziness, syncope, light-headedness, numbness and headaches.  Hematological:  Negative for adenopathy.  Psychiatric/Behavioral:  Negative for agitation, confusion, sleep disturbance and suicidal ideas. The patient is not nervous/anxious.    BREAST: No symptoms   Objective: BP 112/74   Pulse 98   Ht 5\' 2"  (1.575 m)   Wt 163 lb (73.9 kg)   LMP 06/29/2023 (Exact Date)   BMI 29.81 kg/m    Physical Exam Constitutional:      Appearance: She is well-developed.  Genitourinary:     Vulva normal.  Right Labia: No rash, tenderness or lesions.    Left Labia: No tenderness, lesions or rash.    Vaginal bleeding present.     No vaginal discharge, erythema or tenderness.      Right Adnexa: not tender and no mass present.    Left Adnexa: not tender and no mass present.    No cervical friability or polyp.     Uterus is not enlarged or tender.  Breasts:    Right: No mass, nipple discharge, skin change or tenderness.     Left: No mass, nipple discharge, skin change or tenderness.  Neck:     Thyroid: No thyromegaly.  Cardiovascular:     Rate and Rhythm: Normal rate and regular rhythm.     Heart sounds: Normal heart sounds. No murmur heard. Pulmonary:     Effort: Pulmonary effort is normal.     Breath sounds: Normal breath sounds.  Abdominal:     Palpations: Abdomen is soft.     Tenderness: There is no abdominal tenderness. There is no guarding or rebound.  Musculoskeletal:        General: Normal range of motion.     Cervical back: Normal range of motion.  Lymphadenopathy:     Cervical: No cervical adenopathy.  Neurological:     General: No focal deficit present.     Mental Status: She is alert and oriented to person, place, and time.     Cranial Nerves: No cranial nerve deficit.  Skin:    General: Skin is warm and  dry.  Psychiatric:        Mood and Affect: Mood normal.        Behavior: Behavior normal.        Thought Content: Thought content normal.        Judgment: Judgment normal.  Vitals reviewed.     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   Encounter for screening mammogram for malignant neoplasm of breast - Plan: US  LIMITED ULTRASOUND INCLUDING AXILLA RIGHT BREAST, US  LIMITED ULTRASOUND INCLUDING AXILLA LEFT BREAST , MM 3D DIAGNOSTIC MAMMOGRAM BILATERAL BREAST; pt to schedule mammo 11/25  Abnormal mammogram - Plan: US  LIMITED ULTRASOUND INCLUDING AXILLA RIGHT BREAST, US  LIMITED ULTRASOUND INCLUDING AXILLA LEFT BREAST , MM 3D DIAGNOSTIC MAMMOGRAM BILATERAL BREAST  Family history of breast cancer - Plan: US  LIMITED ULTRASOUND INCLUDING AXILLA RIGHT BREAST, US  LIMITED ULTRASOUND INCLUDING AXILLA LEFT BREAST , MM 3D DIAGNOSTIC MAMMOGRAM BILATERAL BREAST; pt is MyRisk neg  Increased risk of breast cancer--aware of recommendations of monthly SBE, yearly CBE and mammos, as well as scr breast MRI. Will call for MRI ref prn (too late to do this yr). Also discussed chemoprevention and prophylactic surgery. F/u prn. Cont Vit D supp.   Vaginal dryness--try coconut oil prn moisturizer/lubricant. If sx persist, will try vag ERT.             GYN counsel breast self exam, mammography screening, adequate intake of calcium and vitamin D , diet and exercise     F/U  Return in about 1 year (around 06/28/2024).  Amsi Grimley B. Dominico Rod, PA-C 06/29/2023 3:27 PM

## 2023-06-29 ENCOUNTER — Encounter: Payer: Self-pay | Admitting: Obstetrics and Gynecology

## 2023-06-29 ENCOUNTER — Other Ambulatory Visit (HOSPITAL_COMMUNITY)
Admission: RE | Admit: 2023-06-29 | Discharge: 2023-06-29 | Disposition: A | Discharge status: Home / Self Care | Source: Ambulatory Visit | Attending: Obstetrics and Gynecology | Admitting: Obstetrics and Gynecology

## 2023-06-29 ENCOUNTER — Ambulatory Visit (INDEPENDENT_AMBULATORY_CARE_PROVIDER_SITE_OTHER): Admitting: Obstetrics and Gynecology

## 2023-06-29 VITALS — BP 112/74 | HR 98 | Ht 62.0 in | Wt 163.0 lb

## 2023-06-29 DIAGNOSIS — Z9189 Other specified personal risk factors, not elsewhere classified: Secondary | ICD-10-CM

## 2023-06-29 DIAGNOSIS — N898 Other specified noninflammatory disorders of vagina: Secondary | ICD-10-CM

## 2023-06-29 DIAGNOSIS — R928 Other abnormal and inconclusive findings on diagnostic imaging of breast: Secondary | ICD-10-CM

## 2023-06-29 DIAGNOSIS — R87810 Cervical high risk human papillomavirus (HPV) DNA test positive: Secondary | ICD-10-CM | POA: Insufficient documentation

## 2023-06-29 DIAGNOSIS — Z124 Encounter for screening for malignant neoplasm of cervix: Secondary | ICD-10-CM

## 2023-06-29 DIAGNOSIS — Z01419 Encounter for gynecological examination (general) (routine) without abnormal findings: Secondary | ICD-10-CM | POA: Diagnosis not present

## 2023-06-29 DIAGNOSIS — Z1151 Encounter for screening for human papillomavirus (HPV): Secondary | ICD-10-CM | POA: Diagnosis present

## 2023-06-29 DIAGNOSIS — Z1231 Encounter for screening mammogram for malignant neoplasm of breast: Secondary | ICD-10-CM

## 2023-06-29 DIAGNOSIS — Z803 Family history of malignant neoplasm of breast: Secondary | ICD-10-CM

## 2023-06-29 NOTE — Patient Instructions (Signed)
 I value your feedback and you entrusting [place] with your care. If you get a [place] patient survey, I would appreciate you taking the time to let us know about your experience today. Thank you! ? ? ?

## 2023-07-04 LAB — CYTOLOGY - PAP
Comment: NEGATIVE
Diagnosis: NEGATIVE
High risk HPV: NEGATIVE

## 2023-12-03 ENCOUNTER — Other Ambulatory Visit: Payer: Self-pay | Admitting: Medical Genetics

## 2024-01-18 ENCOUNTER — Encounter

## 2024-04-02 ENCOUNTER — Telehealth: Payer: Self-pay | Admitting: Licensed Clinical Social Worker

## 2024-04-02 NOTE — Telephone Encounter (Signed)
 Patient called to inquire about services. LCSW provided information about [facility] and [facility].
